# Patient Record
Sex: Female | Born: 1955 | Race: White | Hispanic: No | Marital: Married
Health system: Midwestern US, Academic
[De-identification: ages and names within clinical notes are randomized; demographics above are authoritative.]

---

## 2021-11-01 ENCOUNTER — Encounter: Admit: 2021-11-01 | Discharge: 2021-11-01 | Payer: BC Managed Care – HMO

## 2021-11-01 DIAGNOSIS — C8170 Other classical Hodgkin lymphoma, unspecified site: Secondary | ICD-10-CM

## 2021-11-02 ENCOUNTER — Encounter: Admit: 2021-11-02 | Discharge: 2021-11-02 | Payer: BC Managed Care – HMO

## 2021-11-02 DIAGNOSIS — I10 Essential (primary) hypertension: Secondary | ICD-10-CM

## 2021-11-02 DIAGNOSIS — C8178 Other classical Hodgkin lymphoma, lymph nodes of multiple sites: Secondary | ICD-10-CM

## 2021-11-02 MED ORDER — DOXORUBICIN 2 MG/ML IV SOLN
25 mg/m2 | Freq: Once | INTRAVENOUS | 0 refills
Start: 2021-11-02 — End: ?

## 2021-11-02 MED ORDER — DACARBAZINE IVPB
375 mg/m2 | Freq: Once | INTRAVENOUS | 0 refills
Start: 2021-11-02 — End: ?

## 2021-11-02 MED ORDER — DEXAMETHASONE 6 MG PO TAB
12 mg | Freq: Once | ORAL | 0 refills
Start: 2021-11-02 — End: ?

## 2021-11-02 MED ORDER — VINBLASTINE IVPB
6 mg/m2 | Freq: Once | INTRAVENOUS | 0 refills
Start: 2021-11-02 — End: ?

## 2021-11-02 MED ORDER — APREPITANT 7.2 MG/ML IV EMUL
130 mg | Freq: Once | INTRAVENOUS | 0 refills
Start: 2021-11-02 — End: ?

## 2021-11-02 MED ORDER — PALONOSETRON 0.25 MG/5 ML IV SOLN
.25 mg | Freq: Once | INTRAVENOUS | 0 refills
Start: 2021-11-02 — End: ?

## 2021-11-02 MED ORDER — BRENTUXIMAB IVPB
1.2 mg/kg | Freq: Once | INTRAVENOUS | 0 refills
Start: 2021-11-02 — End: ?

## 2021-11-02 NOTE — Progress Notes
Name: Mary Terrell          MRN: [id number]      DOB: 05/31/1956      AGE: 66 y.o.   DATE OF SERVICE: 11/02/2021    Subjective:             Reason for Visit:  New Patient      Mary Terrell is a 66 y.o. female.     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 Donnajean Lopes.    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  Presented December 2022 with close to a year of persistent dry cough and chest x-ray showed a mediastinal mass.  2.  CT scans of the chest abdomen pelvis revealed multifocal lymphadenopathy above and below the diaphragm along with splenic lesions and diffuse sclerosis of the L2 vertebral body suggestive of an infiltrative process.  3.  10/12/2021 excisional biopsy of right axillary node revealed classical Hodgkin lymphoma.    On interview today, she is feeling extremely well.  She continues to have a persistent dry cough but this does not interrupt sleep.  It is bothersome, but not overly so.  She is not significantly short of breath and she does not have any significant chest pain.  She denies fevers, drenching night sweats, or unintentional weight loss.  She has not had any significant pruritus.    I have reviewed and updated the past medical, social and family histories in the history section and they are up to date as of this visit.    I have extensively reviewed the laboratory, pathology and radiology, both internal and external, and the key findings are summarized above.           Review of Systems   Respiratory: Positive for cough.    Hematological: Positive for adenopathy.   All other systems reviewed and are negative.        Objective:         ? losartan-hydroCHLOROthiazide (HYZAAR) 50-12.5 mg tablet Take 1 tablet by mouth every morning.     Vitals:    11/02/21 1556   BP: (!) 132/92   BP Source: Arm, Right Upper   Pulse: 96   Temp: 37 ?C (98.6 ?F)   Resp: 18   SpO2: 99%   TempSrc: Oral   PainSc: Zero   Weight: 73.5 kg (162 lb)   Height: 165.1 cm (5' 5)     Body mass index is 26.96 kg/m?Marland Kitchen     Pain Score: Zero       Fatigue Scale: 7    Pain Addressed:  N/A    Patient Evaluated for a Clinical Trial: No treatment clinical trial available for this patient.     [place] Cooperative Oncology Group performance status is 0, Fully active, able to carry on all pre-disease performance without restriction.Marland Kitchen     Physical Exam  Vitals and nursing note reviewed.   Constitutional:       General: She is not in acute distress.     Appearance: She is well-developed.   HENT:      Head: Normocephalic.   Eyes:  General: No scleral icterus.     Conjunctiva/sclera: Conjunctivae normal.   Cardiovascular:      Rate and Rhythm: Normal rate and regular rhythm.   Pulmonary:      Effort: Pulmonary effort is normal.      Breath sounds: Normal breath sounds.   Abdominal:      Palpations: Abdomen is soft.   Musculoskeletal:      Cervical back: Neck supple.   Lymphadenopathy:      Comments:   Bilateral 1 to 2 cm cervical and supraclavicular nodes   Skin:     Findings: No rash.   Neurological:      Mental Status: She is alert.               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 Advanced stage classical Hodgkin lymphoma  2. Hypertension  3. Hyperlipidemia  4. ECOG PS 0    Plan:  I had a detailed discussion with Mekisha today regarding the natural history, biology, staging, and treatment of classical Hodgkin lymphoma.   We discussed that the treatment algorithms for the disease are divided into 3 separate and distinct subgroups.  These include early stage favorable, early stage unfavorable, and advanced stage disease subtypes.  In his case, she has advanced stage classical Hodgkin lymphoma by GHSG criteria.  We discussed that a PET scan will be definitive, but I am quite suspicious that the L2 lesion seen on CT scans represents a bony focus of Hodgkin lymphoma and as a consequence that she would have stage IV disease.  We discussed that her disease is considered curable regardless of stage and we would be treating her with curative intent.  The recent long-term follow-up from the [facility] 1 study showed an overall survival benefit with the addition of brentuximab vedotin to AVD compared to ABVD therapy.  Unfortunately elderly patients were underrepresented on this study, with fewer than 10% of the study population being over 73 years old.  Typically tolerance of BV plus AVD is not as good among the elderly population and there are multiple other regimens that have been used in the setting which have reasonable outcomes, but certainly do not appear to have the same curative potential.  She is 66 years old and quite fit and I believe that she should be treated aggressively with BV plus AVD.  She agrees.  Check full baseline lymphoma labs, including ESR  Check PET/CT.  Recent data indicate there is no need to check a bone marrow exam unless this would alter treatment.  Given that we know that she has at least stage III disease I do not think that a bone marrow biopsy will be helpful in her case.  Check TTE  Start allopurinol 300mg  qHS a couple of days prior to chemotherapy.  Chemotherapy teaching visit next available  Port placement next available  Start BV + AVD with C1D1 = 11/11/2021  PEGfilgrastim day for 3 = 11/14/2021  Repeat PET/CT after 2 cycles of therapy.  RTC with Lyla Son for education, with me for C1 D15, or sooner should new or concerning symptoms develop.    I have discussed the diagnosis and treatment plan with the patient and she expresses understanding and wishes to proceed.    The above discussed with Dr. Donnajean Lopes, the referring physician, conclusion of today's visit.

## 2021-11-02 NOTE — Patient Instructions
Thank you for choosing the [facility]    Dr. Marc Hoffmann's clinic information:    The preferred method of communication to reach our clinic is by sending your question through the mychart portal. Select "ask a question" and choose the recipient as Dr. Hoffmann for the quickest response from one of his staff members.  If you have difficulties signing up for this or with signing into your account, please reach out to [phone] for assistance.     The Clinical Nurse Coordinator for Dr. Hoffmann is Laura-Lee Villegas, RN and her direct line is [phone].     Mychart messages and Joletta Manner's direct line are answered during normal business hours only. Please do not page our clinic for non-urgent needs as this takes away our attention from patients being seen in clinic that day.     If you have a scheduling concern or need to make a change in your schedule please contact [phone] and ask to speak to a scheduling representative.     If you have a concern after hours that needs addressed immediately you can call [phone] and speak with the on-call physician for further direction.

## 2021-11-02 NOTE — Progress Notes
[facility] [place] # [id number], Path Date 10/12/21 , requested on 11/02/2021 from [facility].    Contact for outside path lab is [phone]  Tracking [phone]

## 2021-11-03 ENCOUNTER — Encounter: Admit: 2021-11-03 | Discharge: 2021-11-03 | Payer: BC Managed Care – HMO

## 2021-11-04 ENCOUNTER — Encounter: Admit: 2021-11-04 | Discharge: 2021-11-04 | Payer: BC Managed Care – HMO

## 2021-11-04 ENCOUNTER — Ambulatory Visit: Admit: 2021-11-04 | Discharge: 2021-11-04 | Payer: BC Managed Care – HMO

## 2021-11-04 DIAGNOSIS — C8198 Hodgkin lymphoma, unspecified, lymph nodes of multiple sites: Secondary | ICD-10-CM

## 2021-11-04 DIAGNOSIS — I10 Essential (primary) hypertension: Secondary | ICD-10-CM

## 2021-11-04 DIAGNOSIS — C8170 Other classical Hodgkin lymphoma, unspecified site: Secondary | ICD-10-CM

## 2021-11-04 LAB — CBC AND DIFF
ABSOLUTE BASO COUNT: 0.1 K/UL (ref 0–0.20)
ABSOLUTE EOS COUNT: 0.2 K/UL (ref 0–0.45)
ABSOLUTE LYMPH COUNT: 0.9 K/UL — ABNORMAL LOW (ref 1.0–4.8)
ABSOLUTE MONO COUNT: 1 K/UL — ABNORMAL HIGH (ref 0–0.80)
ABSOLUTE NEUTROPHIL: 10 K/UL — ABNORMAL HIGH (ref 1.8–7.0)
BASOPHILS %: 0 % (ref 0–2)
EOSINOPHILS %: 1 % (ref >60)
HEMATOCRIT: 34 % — ABNORMAL LOW (ref 36–45)
HEMOGLOBIN: 11 g/dL — ABNORMAL LOW (ref 12.0–15.0)
LYMPHOCYTES %: 7 % — ABNORMAL LOW (ref 24–44)
MCH: 25 pg — ABNORMAL LOW (ref 26–34)
MCV: 81 FL (ref 80–100)
MONOCYTES %: 8 % — ABNORMAL HIGH (ref 4–12)
MPV: 7.5 FL (ref 7–11)
NEUTROPHILS %: 84 % — ABNORMAL HIGH (ref 41–77)
PLATELET COUNT: 440 K/UL — ABNORMAL HIGH (ref 150–400)
RBC COUNT: 4.2 M/UL — ABNORMAL HIGH (ref 4.0–5.0)
RDW: 15 % — ABNORMAL HIGH (ref 11–15)
WBC COUNT: 12 K/UL — ABNORMAL HIGH (ref 4.5–11.0)

## 2021-11-04 LAB — HEPATITIS B CORE AB TOT (IGG+IGM)

## 2021-11-04 LAB — HEPATITIS B SURFACE AB: HEP B SURFACE ABY: NEGATIVE

## 2021-11-04 LAB — LDH-LACTATE DEHYDROGENASE: LDH: 440 U/L — ABNORMAL HIGH (ref 100–210)

## 2021-11-04 LAB — IMMUNOGLOBULINS-IGA,IGG,IGM
IGA: 114 mg/dL (ref 70–390)
IGG: 105 mg/dL (ref 762–1488)
IGM: 174 mg/dL (ref 38–328)

## 2021-11-04 LAB — HIV 1 & 2 AG-AB SCRN W REFLEX TO HIV CONFIRMATION

## 2021-11-04 LAB — PHOSPHORUS: PHOSPHORUS: 3.3 mg/dL (ref 2.0–4.5)

## 2021-11-04 LAB — PTT (APTT): PTT: 32 s (ref 24.0–36.5)

## 2021-11-04 LAB — D-DIMER: D-DIMER: 683 ng{FEU}/mL — ABNORMAL HIGH (ref <500)

## 2021-11-04 LAB — KAPPA/LAMBDA FREE LIGHT CHAINS
KAPPA FLC: 2.6 mg/dL — ABNORMAL HIGH (ref 0.33–1.94)
LAMBDA FLC: 1.7 mg/dL (ref 0.57–2.63)

## 2021-11-04 LAB — HEPATITIS C ANTIBODY W REFLEX HCV PCR QUANT

## 2021-11-04 LAB — PROTIME INR (PT)
INR: 1.3 — ABNORMAL HIGH (ref 0.8–1.2)
PROTIME: 14 s — ABNORMAL HIGH (ref 9.5–14.2)

## 2021-11-04 LAB — SED RATE: ESR: 72 mm/h — ABNORMAL HIGH (ref 0–30)

## 2021-11-04 LAB — COMPREHENSIVE METABOLIC PANEL
POTASSIUM: 3.8 MMOL/L (ref 3.5–5.1)
SODIUM: 139 MMOL/L (ref 137–147)

## 2021-11-04 LAB — URIC ACID: URIC ACID: 6.1 mg/dL (ref 2.0–7.0)

## 2021-11-04 LAB — FIBRINOGEN: FIBRINOGEN: 585 mg/dL — ABNORMAL HIGH (ref 200–400)

## 2021-11-04 LAB — HEPATITIS B SURFACE AG

## 2021-11-04 MED ORDER — CEFAZOLIN 1 GRAM IJ SOLR
2 g | Freq: Once | INTRAVENOUS | 0 refills
Start: 2021-11-04 — End: ?

## 2021-11-04 MED ORDER — ONDANSETRON HCL 8 MG PO TAB
8 mg | ORAL_TABLET | ORAL | 3 refills | 8.00000 days | Status: AC | PRN
Start: 2021-11-04 — End: ?

## 2021-11-04 NOTE — Telephone Encounter
Pt scheduled for Port Insert with Dr.Alley on 11/09/21 at [place].    Instructed use of anesthesia vs. local only would be determined the morning of surgery.    Instructions given, pt states understanding.

## 2021-11-04 NOTE — Telephone Encounter
Attempted to contact patient to schedule port placement, LVM for callback.

## 2021-11-04 NOTE — Telephone Encounter
Recieved message from [facility] that facility does not have contract with patient insurance. Will keep surgery date of 2/15 but change to [facility] location.  Called and left pt VM and will send mychart to let her know we are moving to [facility]>

## 2021-11-04 NOTE — Progress Notes
Name: Mary Terrell          MRN: [id number]      DOB: May 05, 1956      AGE: 66 y.o.   DATE OF SERVICE: 11/04/2021    Subjective:             Reason for Visit:  Follow Up      Mary Terrell is a 66 y.o. female.     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 Mikey Bussing with Hodgkin's lymphoma.  Mary Terrell is a patient of Dr. Donnajean Lopes.    Mary Terrell will be initiating brentuximab plus AVD on 2/17 with pegfilgrastim support.  Mary Terrell is here for education.  ?  HPI  1.  Presented December 2022 with close to a year of persistent dry cough and chest x-ray showed a mediastinal mass.  2.  CT scans of the chest abdomen pelvis revealed multifocal lymphadenopathy above and below the diaphragm along with splenic lesions and diffuse sclerosis of the L2 vertebral body suggestive of an infiltrative process.  3.  10/12/2021 excisional biopsy of right axillary node revealed classical Hodgkin lymphoma.  4. AV+AVD      Subjective  Complains of dry cough  Complains of lymph nodes in her neck but no pain  Energy is good  Mary Terrell continues to work without trouble  No fevers or infectious symptoms  No shortness of breath or cough    Mary Terrell is alone     Review of Systems      Objective:         ? losartan-hydroCHLOROthiazide (HYZAAR) 50-12.5 mg tablet Take 1 tablet by mouth every morning.     Vitals:    11/04/21 1108   BP: 136/82   BP Source: Arm, Right Upper   Pulse: 80   Temp: 37.2 ?C (99 ?F)   Resp: 18   SpO2: 100%   TempSrc: Oral   PainSc: Zero   Weight: 72.8 kg (160 lb 9.6 oz)     Body mass index is 26.73 kg/m?Marland Kitchen     Pain Score: Zero         Pain Addressed:  [place] Cooperative Oncology Group performance status is 0, Fully active, able to carry on all pre-disease performance without restriction.Marland Kitchen     Physical Exam  Vitals reviewed.   Pulmonary:      Effort: Pulmonary effort is normal.   Neurological:      General: No focal deficit present.      Mental Status: Mary Terrell is oriented to person, place, and time.   Psychiatric:         Mood and Affect: Mood normal.         Behavior: Behavior normal.            CBC w/Diff    Lab Results   Component Value Date/Time    WBC 12.9 (H) 11/04/2021 11:01 AM    RBC 4.29 11/04/2021 11:01 AM    HGB 11.1 (L) 11/04/2021 11:01 AM    HCT 34.9 (L) 11/04/2021 11:01 AM    MCV 81.5 11/04/2021 11:01 AM    MCH 25.8 (L) 11/04/2021 11:01 AM    MCHC 31.7 (L) 11/04/2021 11:01 AM  RDW 15.8 (H) 11/04/2021 11:01 AM    PLTCT 440 (H) 11/04/2021 11:01 AM    MPV 7.5 11/04/2021 11:01 AM    Lab Results   Component Value Date/Time    NEUT 84 (H) 11/04/2021 11:01 AM    ANC 10.70 (H) 11/04/2021 11:01 AM    LYMA 7 (L) 11/04/2021 11:01 AM    ALC 0.90 (L) 11/04/2021 11:01 AM    MONA 8 11/04/2021 11:01 AM    AMC 1.00 (H) 11/04/2021 11:01 AM    EOSA 1 11/04/2021 11:01 AM    AEC 0.20 11/04/2021 11:01 AM    BASA 0 11/04/2021 11:01 AM    ABC 0.10 11/04/2021 11:01 AM             Assessment and Plan:         1. Advanced stage classical Hodgkin lymphoma.  Mary Terrell will initiate brentuximab with AVD on 2/17 with pegfilgrastim support on 2/20.  I will see her that day for toxicity check and we will plan to have Dr. Mikey Bussing see her prior to cycle 1 day 15 on 3/3 unless Mary Terrell sees her primary oncologist closer to home to initiate treatment.  2. Education complete and consent signed.  Everything answered to her satisfaction.  Phone numbers and contact information provided.  3. Allopurinol 300 mg x 30 days.  Mary Terrell will start on 11/08/2021.  4. Zofran 8 mg to take every 8 hours as needed sent to pharmacy.  5. Mary Terrell will take Claritin 10 mg x 5 days starting on the day of pegfilgrastim with each cycle.   6. Echocardiogram and port scheduled for 2/15.  7. Baseline lymphoma labs drawn today.  ?    APP Chemotherapy Education      A thorough pre-assessment and teaching session explaining the mechanism of action, possible side effects, precautions and instructions regarding brentuximab, lasting, dacarbazine, doxorubicin, pegfilgrastim for curative intent was conducted. The patient will return on 2/17 to initiate treatment. The cycle will repeat every 14 days   Plan of administration was reviewed.      Both written and verbal information were given to the patient.    The planned course of treatment, anticipated benefits, material risks and potential side effects that may occur with this course of treatment were explained to the patient.  Side effects and their management were discussed in detail and include, but are not limited to:      Discussed schedule of chemotherapy. Patient will receive treatment as follows on Days 1 and 15 of a 28-day cycle:       Both written and verbal information were given to the patient.     The planned course of treatment, anticipated benefits, material risks and potential side effects that may occur with this course of treatment were explained to the patient.  Side effects and their management were discussed in detail and include, but are not limited to:   ? Low blood counts (explained associated risk for infection, bleeding, bruising, fatigue)  ? Nausea and vomiting (explained purpose of scheduled antiemetics before chemotherapy and the use of PRNs in the event of breakthrough CINV; encouraged patient to maintain adequate water/nutrition intake)  ? Changes in bowel habits   ? Anthracycline-induced cardiotoxicity (explained that cardiac function is monitored closely throughout the treatment course)   ? Alopecia  ? Hypersensitivity reactions  ? Peripheral neuropathy  ? Rash  ? Infusion/hypersensitivity reactions          The patient has received contact information for the  clinic and was instructed on when and who to call.      The patient verbalized understanding, was given the opportunity to ask questions, and the consent form was signed.        This was a face to face encounter with 60 minutes spent in counseling and coordination of care.

## 2021-11-09 ENCOUNTER — Encounter: Admit: 2021-11-09 | Discharge: 2021-11-09 | Payer: BC Managed Care – HMO

## 2021-11-09 ENCOUNTER — Ambulatory Visit: Admit: 2021-11-09 | Discharge: 2021-11-09 | Payer: BC Managed Care – HMO

## 2021-11-09 DIAGNOSIS — C8170 Other classical Hodgkin lymphoma, unspecified site: Secondary | ICD-10-CM

## 2021-11-09 DIAGNOSIS — C8178 Other classical Hodgkin lymphoma, lymph nodes of multiple sites: Principal | ICD-10-CM

## 2021-11-09 DIAGNOSIS — I10 Essential (primary) hypertension: Secondary | ICD-10-CM

## 2021-11-09 MED ORDER — ONDANSETRON HCL (PF) 4 MG/2 ML IJ SOLN
INTRAVENOUS | 0 refills | Status: DC
Start: 2021-11-09 — End: 2021-11-09
  Administered 2021-11-09: 16:00:00 4 mg via INTRAVENOUS

## 2021-11-09 MED ORDER — PHENYLEPHRINE HCL 10 MG/ML IJ SOLN
INTRAVENOUS | 0 refills | Status: DC
Start: 2021-11-09 — End: 2021-11-09
  Administered 2021-11-09: 17:00:00 100 ug via INTRAVENOUS

## 2021-11-09 MED ORDER — LIDOCAINE (PF) 100 MG/5 ML (2 %) IV SYRG
INTRAVENOUS | 0 refills | Status: DC
Start: 2021-11-09 — End: 2021-11-09
  Administered 2021-11-09: 16:00:00 20 mg via INTRAVENOUS
  Administered 2021-11-09: 16:00:00 70 mg via INTRAVENOUS

## 2021-11-09 MED ORDER — MIDAZOLAM 1 MG/ML IJ SOLN
INTRAVENOUS | 0 refills | Status: DC
Start: 2021-11-09 — End: 2021-11-09
  Administered 2021-11-09: 16:00:00 2 mg via INTRAVENOUS

## 2021-11-09 MED ORDER — PROPOFOL 10 MG/ML IV EMUL 50 ML (INFUSION)(AM)(OR)
INTRAVENOUS | 0 refills | Status: DC
Start: 2021-11-09 — End: 2021-11-09
  Administered 2021-11-09: 16:00:00 70 ug/kg/min via INTRAVENOUS

## 2021-11-09 MED ORDER — PROPOFOL 10 MG/ML IV EMUL 20 ML (INFUSION)(AM)(OR)
INTRAVENOUS | 0 refills | Status: DC
Start: 2021-11-09 — End: 2021-11-09

## 2021-11-09 MED ORDER — FENTANYL CITRATE (PF) 50 MCG/ML IJ SOLN
INTRAVENOUS | 0 refills | Status: DC
Start: 2021-11-09 — End: 2021-11-09
  Administered 2021-11-09 (×4): 25 ug via INTRAVENOUS

## 2021-11-09 MED ORDER — PROPOFOL INJ 10 MG/ML IV VIAL
INTRAVENOUS | 0 refills | Status: DC
Start: 2021-11-09 — End: 2021-11-09
  Administered 2021-11-09: 16:00:00 50 mg via INTRAVENOUS

## 2021-11-09 MED ADMIN — CEFAZOLIN 1 GRAM IJ SOLR [1445]: 2 g | INTRAVENOUS | @ 16:00:00 | Stop: 2021-11-09 | NDC 60505614200

## 2021-11-09 MED ADMIN — HEPARIN (PORCINE) 1,000 UNIT/ML IJ SOLN [10176]: 500 mL | @ 16:00:00 | Stop: 2021-11-09 | NDC 63323054015

## 2021-11-09 MED ADMIN — BUPIVACAINE-EPINEPHRINE 0.25 %-1:200,000 IJ SOLN [14983]: 15 mL | INTRAMUSCULAR | @ 17:00:00 | Stop: 2021-11-09 | NDC 00409175250

## 2021-11-09 MED ADMIN — LACTATED RINGERS IV SOLP [4318]: 1000.000 mL | INTRAVENOUS | @ 15:00:00 | Stop: 2021-11-09 | NDC 00338011704

## 2021-11-09 MED ADMIN — SODIUM CHLORIDE 0.9 % IV SOLP [27838]: 500 mL | @ 16:00:00 | Stop: 2021-11-09 | NDC 00338004903

## 2021-11-09 NOTE — Anesthesia Post-Procedure Evaluation
Post-Anesthesia Evaluation    Name: Mary Terrell      MRN: [id number]     DOB: 04-Sep-1956     Age: 66 y.o.     Sex: female   __________________________________________________________________________     Procedure Information     Anesthesia Start Date/Time: 11/09/21 0959    Procedures:       placement of port-a-cath with fluoroscopy (Chest) - **30 mins.      FLUOROSCOPIC GUIDANCE CENTRAL VENOUS ACCESS DEVICE PLACEMENT/ REPLACEMENT/ REMOVAL (Chest)    Location: [facility] OR 5 / [facility] OR/PERIOP    Surgeons: Kelli Churn, MD          Post-Anesthesia Vitals  BP: 103/75 (02/15 1117)  Temp: 36.8 C (98.2 F) (02/15 1117)  Pulse: 72 (02/15 1117)  Respirations: 20 PER MINUTE (02/15 1117)  SpO2: 92 % (02/15 1117)  SpO2 Pulse: 72 (02/15 1117)  O2 Device: None (Room air) (02/15 1117)  Height: 165.1 cm (5\' 5" ) (02/15 0901)   Vitals Value Taken Time   BP 103/75 11/09/21 1117   Temp 36.8 C (98.2 F) 11/09/21 1117   Pulse 72 11/09/21 1117   Respirations 20 PER MINUTE 11/09/21 1117   SpO2 92 % 11/09/21 1117   O2 Device None (Room air) 11/09/21 1117   ABP     ART BP           Post Anesthesia Evaluation Note    Evaluation location: pre/post  Patient participation: recovered; patient participated in evaluation  Level of consciousness: alert    Pain score: 0  Pain management: adequate    Hydration: normovolemia  Temperature: 36.0C - 38.4C  Airway patency: adequate    Perioperative Events       Post-op nausea and vomiting: no PONV    Postoperative Status  Cardiovascular status: hemodynamically stable  Respiratory status: spontaneous ventilation        Perioperative Events  There were no known notable events for this encounter.

## 2021-11-09 NOTE — Anesthesia Pre-Procedure Evaluation
Anesthesia Pre-Procedure Evaluation    Name: Mary Terrell      MRN: [id number]     DOB: Aug 29, 1956     Age: 66 y.o.     Sex: female   _________________________________________________________________________     Procedure Info:   Procedure Information     Date/Time: 11/09/21 1030    Procedures:       placement of port-a-cath with fluoroscopy - **30 mins.      FLUOROSCOPIC GUIDANCE CENTRAL VENOUS ACCESS DEVICE PLACEMENT/ REPLACEMENT/ REMOVAL    Location: [facility] / [facility]    Surgeons: Teddy Spike, MD          Physical Assessment  Vital Signs (last filed in past 24 hours):  BP: 136/85 (02/15 0912)  Temp: 37.1 ?C (98.8 ?F) (02/15 1191)  Pulse: 85 (02/15 0912)  Respirations: 16 PER MINUTE (02/15 0912)  SpO2: 97 % (02/15 0912)  O2 Device: None (Room air) (02/15 0912)  Height: 165.1 cm (5' 5) (02/15 0901)  Weight: 73.9 kg (163 lb) (02/15 0901)      Patient History   No Known Allergies     Current Medications    Medication Directions   losartan-hydroCHLOROthiazide (HYZAAR) 50-12.5 mg tablet Take one tablet by mouth every morning.   ondansetron HCL (ZOFRAN) 8 mg tablet Take one tablet by mouth every 8 hours as needed for Nausea or Vomiting.         Review of Systems/Medical History      Patient summary reviewed  Nursing notes reviewed  Pertinent labs reviewed    PONV Screening: Non-smoker, Postoperative opioids and Female sex  No history of anesthetic complications  No family history of anesthetic complications      Airway - negative        Pulmonary - negative     Not a current smoker        No indications/hx of asthma    no COPD      Cardiovascular         Exercise tolerance: >4 METS      Beta Blocker therapy: No      Beta blockers within 24 hours: n/a        Hypertension, well controlled      No valvular problems/murmurs        No past MI:        No hx of coronary artery disease      No PTCA      No dysrhythmias      No angina        No dyspnea on exertion      GI/Hepatic/Renal - negative        No GERD,       No liver disease:        No renal disease:        Neuro/Psych - negative      No seizures      No CVA      Musculoskeletal - negative        No neck pain      Endocrine/Other       No diabetes      No hypothyroidism      No anemia      Malignancy (hodgkin's lymphoma):    current      Constitution - negative   Physical Exam    Airway Findings      Mallampati: II      TM distance: >3 FB  Neck ROM: full      Mouth opening: good      Airway patency: adequate    Dental Findings: Negative      Cardiovascular Findings:       Rhythm: regular      Rate: normal      No murmur    Pulmonary Findings:       Breath sounds clear to auscultation.    Neurological Findings:       Alert and oriented x 3    Constitutional findings:       No acute distress      Well-developed      Well-nourished       Diagnostic Tests  Hematology:   Lab Results   Component Value Date    HGB 11.1 11/04/2021    HCT 34.9 11/04/2021    PLTCT 440 11/04/2021    WBC 12.9 11/04/2021    NEUT 84 11/04/2021    ANC 10.70 11/04/2021    ALC 0.90 11/04/2021    MONA 8 11/04/2021    AMC 1.00 11/04/2021    EOSA 1 11/04/2021    ABC 0.10 11/04/2021    MCV 81.5 11/04/2021    MCH 25.8 11/04/2021    MCHC 31.7 11/04/2021    MPV 7.5 11/04/2021    RDW 15.8 11/04/2021         General Chemistry:   Lab Results   Component Value Date    NA 139 11/04/2021    K 3.8 11/04/2021    CL 100 11/04/2021    CO2 25 11/04/2021    GAP 14 11/04/2021    BUN 17 11/04/2021    CR 0.81 11/04/2021    GLU 101 11/04/2021    CA 9.3 11/04/2021    ALBUMIN 3.7 11/04/2021    TOTBILI 0.6 11/04/2021    PO4 3.3 11/04/2021      Coagulation:   Lab Results   Component Value Date    PT 14.3 11/04/2021    PTT 32.9 11/04/2021    INR 1.3 11/04/2021         Anesthesia Plan    ASA score: 2   Plan: MAC  Induction method: intravenous  NPO status: acceptable      Informed Consent  Anesthetic plan and risks discussed with patient.        Plan discussed with: anesthesiologist, CRNA and surgeon/proceduralist.  Comments: (I discussed the risks/benefits of proceeding with Monitored Anesthesia Care including: 1)possible awareness during the procedure 2) change of anesthetic plan and conversion to a general anesthetic 3)the need to be awakened during key portions of the procedure by the surgeon.  The patient expressed understanding and will proceed with Monitored Anesthesia Care.)

## 2021-11-11 ENCOUNTER — Encounter: Admit: 2021-11-11 | Discharge: 2021-11-11 | Payer: BC Managed Care – HMO

## 2021-11-11 DIAGNOSIS — C8178 Other classical Hodgkin lymphoma, lymph nodes of multiple sites: Secondary | ICD-10-CM

## 2021-11-11 DIAGNOSIS — I10 Essential (primary) hypertension: Secondary | ICD-10-CM

## 2021-11-11 MED ORDER — HEPARIN, PORCINE (PF) 100 UNIT/ML IV SYRG
500 [IU] | Freq: Once | 0 refills | Status: CP
Start: 2021-11-11 — End: ?

## 2021-11-11 MED ORDER — BRENTUXIMAB IVPB
1.2 mg/kg | Freq: Once | INTRAVENOUS | 0 refills | Status: CP
Start: 2021-11-11 — End: ?
  Administered 2021-11-11 (×2): 89.4 mg via INTRAVENOUS

## 2021-11-11 MED ORDER — DACARBAZINE IVPB
375 mg/m2 | Freq: Once | INTRAVENOUS | 0 refills | Status: CP
Start: 2021-11-11 — End: ?
  Administered 2021-11-11 (×2): 693.8 mg via INTRAVENOUS

## 2021-11-11 MED ORDER — VINBLASTINE IVPB
6 mg/m2 | Freq: Once | INTRAVENOUS | 0 refills | Status: CP
Start: 2021-11-11 — End: ?
  Administered 2021-11-11 (×2): 11.1 mg via INTRAVENOUS

## 2021-11-11 MED ORDER — PALONOSETRON 0.25 MG/5 ML IV SOLN
.25 mg | Freq: Once | INTRAVENOUS | 0 refills | Status: CP
Start: 2021-11-11 — End: ?
  Administered 2021-11-11: 18:00:00 0.25 mg via INTRAVENOUS

## 2021-11-11 MED ORDER — APREPITANT 7.2 MG/ML IV EMUL
130 mg | Freq: Once | INTRAVENOUS | 0 refills | Status: CP
Start: 2021-11-11 — End: ?
  Administered 2021-11-11: 18:00:00 130 mg via INTRAVENOUS

## 2021-11-11 MED ORDER — DEXAMETHASONE 6 MG PO TAB
12 mg | Freq: Once | ORAL | 0 refills | Status: CP
Start: 2021-11-11 — End: ?
  Administered 2021-11-11: 18:00:00 12 mg via ORAL

## 2021-11-11 MED ORDER — DOXORUBICIN 2 MG/ML IV SOLN
25 mg/m2 | Freq: Once | INTRAVENOUS | 0 refills | Status: CP
Start: 2021-11-11 — End: ?
  Administered 2021-11-11: 19:00:00 46.26 mg via INTRAVENOUS

## 2021-11-11 NOTE — Progress Notes
CHEMO NOTE  Verified chemo consent signed and in chart.    Blood return positive via: Port (Single)    BSA and dose double checked (agree with orders as written) with: yes  see MAR    Labs/applicable tests checked: CBC and Comprehensive Metabolic Panel (CMP)    Chemo regimen: Drug/cycle/day cycle 1, day 1 Adriamycin, Velban, Dacarbazine, Brentuximab    Rate verified and armband double check with second RN: yes    Patient education offered and stated understanding. Denies questions at this time.

## 2021-11-13 NOTE — Progress Notes
Name: Mary Terrell          MRN: [id number]      DOB: 11/21/1955      AGE: 66 y.o.   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 female patient of Dr.Hoffmann's with cHL seen today for toxicity check, labs and pegfilgrastim injection.  She is cycle 1 day 4.    HPI  1.  Presented December 2022 with close to a year of persistent dry cough and chest x-ray showed a mediastinal mass.  2.  CT scans of the chest abdomen pelvis revealed multifocal lymphadenopathy above and below the diaphragm along with splenic lesions and diffuse sclerosis of the L2 vertebral body suggestive of an infiltrative process.  3.  10/12/2021 excisional biopsy of right axillary node revealed classical Hodgkin lymphoma.  4. AV+AVD started 11/11/2021      Interim History 11/14/2021:  I'm doing really well.   Endorses strange feeling on back of tongue. Denies taste changes, coating feeling, or pain. Not interfering with diet.   Cough is much better.   Patient thinks lymph nodes are smaller, bilateral neck  Eating and drinking well. Drinking ~60oz water daily.   More tired over weekend, sleeping OK  Endorses nocturia at baseline.   Denies nausea, vomiting, diarrhea, or constipation.   Taking allopurinol  Started Claritin.     Denies fevers, chills, drenching night sweats, unintentional weight loss or new lymphadenopathy.   Denies respiratory symptoms.   No infections, transfusions, or unplanned hospitalizations in the interim.    She is with her husband.     Review of Systems   Constitutional: Positive for fatigue.   HENT: Negative for mouth sores.         Strange taste Respiratory: Positive for cough.    Gastrointestinal: Negative for abdominal pain, constipation, diarrhea, nausea and vomiting.   Genitourinary:        Nocturia   Neurological: Negative for numbness.   Hematological: Positive for adenopathy.         Objective:         ? losartan-hydroCHLOROthiazide (HYZAAR) 50-12.5 mg tablet Take one tablet by mouth every morning.   ? ondansetron HCL (ZOFRAN) 8 mg tablet Take one tablet by mouth every 8 hours as needed for Nausea or Vomiting.     Vitals:    11/14/21 1518 11/14/21 1524 11/14/21 1525   BP: (!) 148/87     BP Source: Arm, Right Upper Arm, Right Upper    Pulse: 85     Temp: 36.9 ?C (98.4 ?F)     Resp: 16     SpO2: 100%     O2 Device:  None (Room air)    TempSrc: Oral Oral    PainSc: Zero  Zero   Weight: 73.5 kg (162 lb) 73.5 kg (162 lb)    Height: 165.1 cm (5' 5)       Body mass index is 26.96 kg/m?Marland Kitchen     Pain Score:  Zero         Pain Addressed:  [place] Cooperative Oncology Group performance status is 0, Fully active, able to carry on all pre-disease performance without restriction.Marland Kitchen     Physical Exam  Vitals reviewed.   Cardiovascular:      Arteriovenous access: left arteriovenous access is present.     Comments: Left chest PAC, well healed  Pulmonary:      Effort: Pulmonary effort is normal.   Lymphadenopathy:      Cervical: Cervical adenopathy present.      Right cervical: Superficial cervical adenopathy present.      Left cervical: Superficial cervical adenopathy present.      Upper Body:      Right upper body: No supraclavicular or axillary adenopathy.      Left upper body: No supraclavicular or axillary adenopathy.      Lower Body: No right inguinal adenopathy. No left inguinal adenopathy.      Comments: Cervical adenopathy R>L   Neurological:      General: No focal deficit present.      Mental Status: She is oriented to person, place, and time.   Psychiatric:         Mood and Affect: Mood normal.         Behavior: Behavior normal.            CBC w diff  CBC with Diff Latest Ref Rng & Units 11/14/2021 11/11/2021 11/04/2021   WBC 4.5 - 11.0 K/UL 5.8 16.1(H) 12.9(H)   RBC 4.0 - 5.0 M/UL 4.23 4.11 4.29   HGB 12.0 - 15.0 GM/DL 10.9(L) 10.6(L) 11.1(L)   HCT 36 - 45 % 34.3(L) 33.4(L) 34.9(L)   MCV 80 - 100 FL 81.0 81.3 81.5   MCH 26 - 34 PG 25.6(L) 25.8(L) 25.8(L)   MCHC 32.0 - 36.0 G/DL 31.7(L) 31.8(L) 31.7(L)   RDW 11 - 15 % 15.8(H) 15.9(H) 15.8(H)   PLT 150 - 400 K/UL 244 428(H) 440(H)   MPV 7 - 11 FL 7.6 7.2 7.5   NEUT 41 - 77 % 83(H) 80(H) 84(H)   ANC 1.8 - 7.0 K/UL 4.90 12.80(H) 10.70(H)   LYMA 24 - 44 % 12(L) 8(L) 7(L)   ALYM 1.0 - 4.8 K/UL 0.70(L) 1.30 0.90(L)   MONA 4 - 12 % 2(L) 10 8   AMONO 0 - 0.80 K/UL 0.10 1.60(H) 1.00(H)   EOSA 0 - 5 % 2 2 1    AEOS 0 - 0.45 K/UL 0.10 0.40 0.20   BASA 0 - 2 % 1 0 0   ABAS 0 - 0.20 K/UL 0.00 0.10 0.10     Comprehensive Metabolic Profile  CMP Latest Ref Rng & Units 11/11/2021 11/04/2021   NA 137 - 147 MMOL/L 140 139   K 3.5 - 5.1 MMOL/L 3.7 3.8   CL 98 - 110 MMOL/L 101 100   CO2 21 - 30 MMOL/L 27 25   GAP 3 - 12 12 14(H)   BUN 7 - 25 MG/DL 18 17   CR 0.4 - 1.61 MG/DL 0.96 0.45   GLUX 70 - 409 MG/DL 81(X) 914(N)   CA 8.5 - 10.6 MG/DL 9.6 9.3   TP 6.0 - 8.0 G/DL 7.3 7.3   ALB 3.5 - 5.0 G/DL 3.7 3.7   ALKP 25 - 829 U/L 88 91   ALT 7 - 56 U/L 11 12   TBILI 0.3 - 1.2 MG/DL 0.6 0.6          Assessment  and Plan:     1. Advanced stage classical Hodgkin lymphoma.  She initiated brentuximab with AVD on 2/17 with pegfilgrastim support today-2/20.  She did well with treatment and has noticed improvement in her adenopathy.  She is decided to stay with [facility] for treatment.  She will follow-up with Dr. Mikey Bussing prior to her next treatment.  She will call in the interim with any problems or concerns.  2. Repeat PET/CT after 2 cycles of therapy.   3. Continue Allopurinol 300 mg   4. Continue Zofran 8 mg to take every 8 hours as needed sent to pharmacy.  5. Continue Claritin 10 mg x 5 days starting on the day of pegfilgrastim with each cycle. 6. Recommended salt+baking soda rinse for oral debridement. Patient will call with any signs of open oral  sores.     RTC with Dr. Paulene Floor in 2 weeks (11/25/21) or sooner should new or concerning issues arise.    Donnie Aho, APRN-NP  Nurse Practitioner with Dr. Olene Craven and Dr. Lelon Frohlich  Division of Hematology and Oncology  Pager [phone] or available on Voalte    ATTESTATION ?I personally performed or re-performed the history, physical exam and treatment for the E/M. I discussed the case with the Donnie Aho, APRN in training, and concur with her documentation of history, physical exam and treatment plan unless otherwise noted?Charlann Boxer ARNP  Division of Hematology with Dr Paulene Floor

## 2021-11-14 ENCOUNTER — Encounter: Admit: 2021-11-14 | Discharge: 2021-11-14 | Payer: BC Managed Care – HMO

## 2021-11-14 DIAGNOSIS — I10 Essential (primary) hypertension: Secondary | ICD-10-CM

## 2021-11-14 DIAGNOSIS — C8178 Other classical Hodgkin lymphoma, lymph nodes of multiple sites: Secondary | ICD-10-CM

## 2021-11-14 LAB — CBC AND DIFF
HEMATOCRIT: 34 % — ABNORMAL LOW (ref 36–45)
HEMOGLOBIN: 10 g/dL — ABNORMAL LOW (ref 12.0–15.0)
MCH: 25 pg — ABNORMAL LOW (ref 26–34)
MCHC: 31 g/dL — ABNORMAL LOW (ref 32.0–36.0)
MCV: 81 FL (ref 80–100)
MPV: 7.6 FL (ref 7–11)
NEUTROPHILS %: 83 % — ABNORMAL HIGH (ref 41–77)
PLATELET COUNT: 244 K/UL (ref 150–400)
RBC COUNT: 4.2 M/UL — ABNORMAL HIGH (ref 4.0–5.0)
RDW: 15 % — ABNORMAL HIGH (ref 11–15)
WBC COUNT: 5.8 K/UL (ref 4.5–11.0)

## 2021-11-14 LAB — COMPREHENSIVE METABOLIC PANEL
POTASSIUM: 3.6 MMOL/L (ref 3.5–5.1)
SODIUM: 138 MMOL/L (ref 137–147)

## 2021-11-14 MED ORDER — PEGFILGRASTIM-BMEZ 6 MG/0.6 ML SC SYRG
6 mg | Freq: Once | SUBCUTANEOUS | 0 refills | Status: CP
Start: 2021-11-14 — End: ?
  Administered 2021-11-14: 22:00:00 6 mg via SUBCUTANEOUS

## 2021-11-14 NOTE — Progress Notes
Pt arrived in tx ambulatory. Ziextenzo sc injection administered on pt's abd tissue. Pt left clinic ambulatory in good condition.

## 2021-11-15 ENCOUNTER — Encounter: Admit: 2021-11-15 | Discharge: 2021-11-15 | Payer: BC Managed Care – HMO

## 2021-11-25 ENCOUNTER — Encounter: Admit: 2021-11-25 | Discharge: 2021-11-25 | Payer: BC Managed Care – HMO

## 2021-11-25 DIAGNOSIS — C8178 Other classical Hodgkin lymphoma, lymph nodes of multiple sites: Secondary | ICD-10-CM

## 2021-11-25 DIAGNOSIS — I10 Essential (primary) hypertension: Secondary | ICD-10-CM

## 2021-11-25 MED ORDER — BRENTUXIMAB IVPB
1.2 mg/kg | Freq: Once | INTRAVENOUS | 0 refills | Status: CP
Start: 2021-11-25 — End: ?
  Administered 2021-11-25 (×2): 89.4 mg via INTRAVENOUS

## 2021-11-25 MED ORDER — VINBLASTINE IVPB
6 mg/m2 | Freq: Once | INTRAVENOUS | 0 refills | Status: CP
Start: 2021-11-25 — End: ?
  Administered 2021-11-25 (×2): 11.1 mg via INTRAVENOUS

## 2021-11-25 MED ORDER — APREPITANT 7.2 MG/ML IV EMUL
130 mg | Freq: Once | INTRAVENOUS | 0 refills | Status: CP
Start: 2021-11-25 — End: ?
  Administered 2021-11-25: 17:00:00 130 mg via INTRAVENOUS

## 2021-11-25 MED ORDER — DOXORUBICIN 2 MG/ML IV SOLN
25 mg/m2 | Freq: Once | INTRAVENOUS | 0 refills | Status: CP
Start: 2021-11-25 — End: ?
  Administered 2021-11-25: 18:00:00 46.26 mg via INTRAVENOUS

## 2021-11-25 MED ORDER — DACARBAZINE IVPB
375 mg/m2 | Freq: Once | INTRAVENOUS | 0 refills | Status: CP
Start: 2021-11-25 — End: ?
  Administered 2021-11-25 (×2): 693.8 mg via INTRAVENOUS

## 2021-11-25 MED ORDER — PALONOSETRON 0.25 MG/5 ML IV SOLN
.25 mg | Freq: Once | INTRAVENOUS | 0 refills | Status: CP
Start: 2021-11-25 — End: ?
  Administered 2021-11-25: 17:00:00 0.25 mg via INTRAVENOUS

## 2021-11-25 MED ORDER — HEPARIN, PORCINE (PF) 100 UNIT/ML IV SYRG
500 [IU] | Freq: Once | 0 refills | Status: CP
Start: 2021-11-25 — End: ?

## 2021-11-25 MED ORDER — DEXAMETHASONE 6 MG PO TAB
12 mg | Freq: Once | ORAL | 0 refills | Status: CP
Start: 2021-11-25 — End: ?
  Administered 2021-11-25: 17:00:00 12 mg via ORAL

## 2021-11-25 NOTE — Progress Notes
CHEMO NOTE  Verified chemo consent signed and in chart.    Blood return positive via: Port (Single)    BSA and dose double checked (agree with orders as written) with: yes, see MAR.     Labs/applicable tests checked: CBC and Comprehensive Metabolic Panel (CMP)    Chemo regimen: Day 15, Cycle 1; Adriamycin, Velban, DTIC-DOME, Adcetris    Rate verified and armband double check with second RN: yes    Patient education offered and stated understanding. Denies questions at this time.    Pt arrived for treatment. Port accessed, flushed with good blood return. Port labs obtained. Pt seen by provider and okay to treat. Pre-medication was given. Treatment was tolerated. Pt denied any questions or concerns. Port was flushed, heparinized and de-accessed. Pt discharged ambulatory.

## 2021-11-25 NOTE — Progress Notes
Name: Mary Terrell          MRN: [id number]      DOB: [birth year]      AGE: 66 y.o.   DATE OF SERVICE: 11/25/2021    Subjective:             Reason for Visit:  Heme/Onc Care      Mary Terrell is a 66 y.o. female.      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 Donnajean Lopes.    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  Presented December 2022 with close to a year of persistent dry cough and chest x-ray showed a mediastinal mass.  2.  CT scans of the chest abdomen pelvis revealed multifocal lymphadenopathy above and below the diaphragm along with splenic lesions and diffuse sclerosis of the L2 vertebral body suggestive of an infiltrative process.  3.  10/12/2021 excisional biopsy of right axillary node revealed classical Hodgkin lymphoma.  4.  11/03/2021 PET/CT revealed multifocal lymphadenopathy above and below the diaphragm consistent with stage III disease.  The L2 lesion was not noted hypermetabolic.  5.  BV + AVD initiated    Interim History:  I think it went pretty good.  No significant issues.  No neuropathy.  Noted that she had a peculiar sensation in the back of her throat in the first week.  Had some post-prandial abdominal discomfort.  No significant constipation.  Lymphadenopathy resolved.  Denies fevers, drenching night sweats, or unintentional weight loss.    I have reviewed and updated the past medical, social and family histories in the history section and they are up to date as of this visit.    I have extensively reviewed the laboratory, pathology and radiology, both internal and external, and the key findings are summarized above.         Review of Systems   All other systems reviewed and are negative.        Objective:         ? allopurinoL (ZYLOPRIM) 300 mg tablet Take one tablet by mouth daily. Take with food.   ? losartan-hydroCHLOROthiazide (HYZAAR) 50-12.5 mg tablet Take one tablet by mouth every morning.   ? ondansetron HCL (ZOFRAN) 8 mg tablet Take one tablet by mouth every 8 hours as needed for Nausea or Vomiting.       Vitals:    11/25/21 1020 11/25/21 1023   BP: (!) 165/88    Pulse: 85    Temp: 36.9 ?C (98.4 ?F)    Resp: 18    SpO2: 96%    TempSrc: Oral    PainSc: Zero Zero   Weight: 68.9 kg (152 lb)      Body mass index is 25.29 kg/m?Marland Kitchen     Pain Score: Zero       Fatigue Scale: 5    Pain Addressed:  N/A    Patient Evaluated for a Clinical Trial: Patient not eligible for a treatment trial (including not needing treatment, needs palliative care, in remission).     [place] Cooperative Oncology Group performance status is 0, Fully active, able to  carry on all pre-disease performance without restriction.     Physical Exam  Vitals and nursing note reviewed.   Constitutional:       General: She is not in acute distress.     Appearance: She is well-developed.   HENT:      Head: Normocephalic.   Eyes:      General: No scleral icterus.     Conjunctiva/sclera: Conjunctivae normal.   Cardiovascular:      Rate and Rhythm: Normal rate and regular rhythm.   Pulmonary:      Effort: Pulmonary effort is normal.      Breath sounds: Normal breath sounds.   Abdominal:      Palpations: Abdomen is soft.   Musculoskeletal:      Cervical back: Neck supple.   Lymphadenopathy:      Comments:   Cervical and supraclavicular nodes resolved   Skin:     Findings: No rash.   Neurological:      Mental Status: She is alert.               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 Stage III classical Hodgkin lymphoma  2. Hypertension  3. Hyperlipidemia  4. ECOG PS 0    Plan:  Since last visit, she was comprehensively staged with a PET/CT and fortunately the bone lesion on her CT scan did not show up and she has stage III disease by PET criteria.  She initiated therapy with BV plus AVD and is having an excellent clinical response with resolution of her previously palpable adenopathy, normalization of her leukocytosis and thrombocytosis, and overall improvement in her functional status.  She did not have any significant toxicities of therapy and I am extremely pleased by this.  She has decided to have all of her treatments here and we will plan on keeping her here for the duration of her treatments.    Request PET/CT images for my review and comparison for future PET/CT.  Continue BV + AVD with C1 D15 = 11/25/2021  PEGfilgrastim day 4 = 11/28/2021  Repeat PET/CT after 2 cycles of therapy.  Assuming favorable response, she will have a repeat PET/CT at conclusion of treatment.  Plan continues to be for her to be treated with 6 cycles of treatment.  RTC with Lyla Son in 2 weeks in 4 weeks, with me in 6 weeks, or sooner should new or concerning symptoms develop.    I have discussed the diagnosis and treatment plan with the patient and she expresses understanding and wishes to proceed.    I spent 20 minutes in coordination of care and record review and additional 20 minutes of face-to-face time for total of 40 minutes.

## 2021-11-28 ENCOUNTER — Encounter: Admit: 2021-11-28 | Discharge: 2021-11-28 | Payer: BC Managed Care – HMO

## 2021-11-28 DIAGNOSIS — I10 Essential (primary) hypertension: Secondary | ICD-10-CM

## 2021-11-28 DIAGNOSIS — C8178 Other classical Hodgkin lymphoma, lymph nodes of multiple sites: Secondary | ICD-10-CM

## 2021-11-28 MED ORDER — PEGFILGRASTIM-BMEZ 6 MG/0.6 ML SC SYRG
6 mg | Freq: Once | SUBCUTANEOUS | 0 refills | Status: CP
Start: 2021-11-28 — End: ?
  Administered 2021-11-28: 22:00:00 6 mg via SUBCUTANEOUS

## 2021-11-28 NOTE — Patient Instructions
Call Immediately to report the following:  Uncontrolled nausea and/or vomiting, uncontrolled pain, or unusual bleeding.  Temperature of 100.4 F or greater and/or any sign/symptom of infection (redness, warmth, tenderness)  Painful mouth or difficulty swallowing  Red, cracked, or painful hands and/or feet  Diarrhea   Swelling of arms or legs  Rash    Important Phone Numbers:  [facility] Main Number (answered 24 hours a day) [phone]  Cancer Center Scheduling (appointments) [phone] or [phone]  [facility] (for nutritional supplements) [phone]        Port Maintenance - If you have a port, it should be flushed every 6-8 weeks when not in use.  Please check with your MD, nurse, or the scheduler.

## 2021-11-28 NOTE — Progress Notes
Day 18 Cycle 1 Ziextenzo      Injection given per plan.   Tolerated well.   Discharged in stable condition.

## 2021-12-02 ENCOUNTER — Encounter: Admit: 2021-12-02 | Discharge: 2021-12-02 | Payer: BC Managed Care – HMO

## 2021-12-02 MED ORDER — PANTOPRAZOLE 40 MG PO TBEC
40 mg | ORAL_TABLET | Freq: Every day | ORAL | 1 refills | 90.00000 days | Status: AC
Start: 2021-12-02 — End: ?

## 2021-12-02 MED ORDER — DEXAMETHASONE 4 MG PO TAB
4 mg | ORAL_TABLET | Freq: Every day | ORAL | 0 refills | 12.50000 days | Status: AC
Start: 2021-12-02 — End: ?

## 2021-12-09 ENCOUNTER — Encounter: Admit: 2021-12-09 | Discharge: 2021-12-09 | Payer: BC Managed Care – HMO

## 2021-12-09 DIAGNOSIS — C8178 Other classical Hodgkin lymphoma, lymph nodes of multiple sites: Secondary | ICD-10-CM

## 2021-12-09 DIAGNOSIS — C8170 Other classical Hodgkin lymphoma, unspecified site: Secondary | ICD-10-CM

## 2021-12-09 DIAGNOSIS — I10 Essential (primary) hypertension: Secondary | ICD-10-CM

## 2021-12-09 MED ORDER — BRENTUXIMAB IVPB
1.2 mg/kg | Freq: Once | INTRAVENOUS | 0 refills | Status: CP
Start: 2021-12-09 — End: ?
  Administered 2021-12-09 (×2): 89.4 mg via INTRAVENOUS

## 2021-12-09 MED ORDER — DOXORUBICIN 2 MG/ML IV SOLN
25 mg/m2 | Freq: Once | INTRAVENOUS | 0 refills | Status: CP
Start: 2021-12-09 — End: ?
  Administered 2021-12-09: 16:00:00 46.26 mg via INTRAVENOUS

## 2021-12-09 MED ORDER — VINBLASTINE IVPB
6 mg/m2 | Freq: Once | INTRAVENOUS | 0 refills | Status: CP
Start: 2021-12-09 — End: ?
  Administered 2021-12-09 (×2): 11.1 mg via INTRAVENOUS

## 2021-12-09 MED ORDER — APREPITANT 7.2 MG/ML IV EMUL
130 mg | Freq: Once | INTRAVENOUS | 0 refills | Status: CP
Start: 2021-12-09 — End: ?
  Administered 2021-12-09: 16:00:00 130 mg via INTRAVENOUS

## 2021-12-09 MED ORDER — DEXAMETHASONE 6 MG PO TAB
12 mg | Freq: Once | ORAL | 0 refills | Status: CP
Start: 2021-12-09 — End: ?
  Administered 2021-12-09: 16:00:00 12 mg via ORAL

## 2021-12-09 MED ORDER — PALONOSETRON 0.25 MG/5 ML IV SOLN
.25 mg | Freq: Once | INTRAVENOUS | 0 refills | Status: CP
Start: 2021-12-09 — End: ?
  Administered 2021-12-09: 16:00:00 0.25 mg via INTRAVENOUS

## 2021-12-09 MED ORDER — HEPARIN, PORCINE (PF) 100 UNIT/ML IV SYRG
500 [IU] | Freq: Once | 0 refills | Status: CP
Start: 2021-12-09 — End: ?

## 2021-12-09 MED ORDER — DACARBAZINE IVPB
375 mg/m2 | Freq: Once | INTRAVENOUS | 0 refills | Status: CP
Start: 2021-12-09 — End: ?
  Administered 2021-12-09 (×2): 693.8 mg via INTRAVENOUS

## 2021-12-09 NOTE — Patient Instructions
Call Immediately to report the following:  Uncontrolled nausea and/or vomiting, uncontrolled pain, or unusual bleeding.  Temperature of 100.4 F or greater and/or any sign/symptom of infection (redness, warmth, tenderness)  Painful mouth or difficulty swallowing  Red, cracked, or painful hands and/or feet  Diarrhea   Swelling of arms or legs  Rash    Important Phone Numbers:  [facility] Main Number (answered 24 hours a day) [phone]  Cancer Center Scheduling (appointments) [phone] or [phone]  [facility] (for nutritional supplements) [phone]        Port Maintenance - If you have a port, it should be flushed every 6-8 weeks when not in use.  Please check with your MD, nurse, or the scheduler.

## 2021-12-09 NOTE — Progress Notes
Day 1 Cycle 2 Adriamycin + Vinblastine + Dacarbazine + Brentuximab      Port accessed, labs collected.  Patient to follow up with Morey Hummingbird, NP  OK to treat, labs OK to treat.  Tolerated infusion well.   Port de-accessed per protocol, heparinized.   Discharged in stable condition.         CHEMO NOTE  Verified chemo consent signed and in chart.    Blood return positive via: Port (Single)    BSA and dose double checked (agree with orders as written) with: yes     Labs/applicable tests checked: CBC and Comprehensive Metabolic Panel (CMP)    Chemo regimen: Drug/cycle/day  C2 D1 Ariamycin + Vinblastine + Dacarbazine + Brentuximab    Rate verified and armband double check with second RN: yes, see eMar    Patient education offered and stated understanding. Denies questions at this time.

## 2021-12-09 NOTE — Progress Notes
Name: Mary Terrell          MRN: [id number]      DOB: 1956/06/22      AGE: 66 y.o.   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 female patient of Dr.Hoffmann's with cHL seen today for evaluation, labs and cycle 3 of treatment.    HPI  1.  Presented December 2022 with close to a year of persistent dry cough and chest x-ray showed a mediastinal mass.  2.  CT scans of the chest abdomen pelvis revealed multifocal lymphadenopathy above and below the diaphragm along with splenic lesions and diffuse sclerosis of the L2 vertebral body suggestive of an infiltrative process.  3.  10/12/2021 excisional biopsy of right axillary node revealed classical Hodgkin lymphoma.  4. BV+AVD started 11/11/2021      Interim History 11/14/2021:  [place] opened Wed  Was taking MiraLAX twice daily  Felt bloated  Symptoms started a week after treatment  BMs slowed--went 4 days without a BM  No palpable nodes  Eating and drinking well. Drinking ~50oz water daily.   More tired over weekend, sleeping OK  Endorses nocturia at baseline.   She has not started Protonix  Took dexamethasone for 2 days      Denies fevers, chills, drenching night sweats, unintentional weight loss or new lymphadenopathy.   Denies respiratory symptoms.   No infections, transfusions, or unplanned hospitalizations in the interim.    She is with her husband.     Review of Systems   Constitutional: Positive for fatigue.   HENT:             Respiratory: Negative for cough.    Gastrointestinal: Negative for abdominal pain, constipation, diarrhea, nausea and vomiting.   Genitourinary:        Nocturia Neurological: Negative for numbness.         Objective:         ? allopurinoL (ZYLOPRIM) 300 mg tablet Take one tablet by mouth daily. Take with food.   ? dexAMETHasone (DECADRON) 4 mg tablet Take one tablet by mouth daily. Take with food.   ? losartan-hydroCHLOROthiazide (HYZAAR) 50-12.5 mg tablet Take one tablet by mouth every morning.   ? ondansetron HCL (ZOFRAN) 8 mg tablet Take one tablet by mouth every 8 hours as needed for Nausea or Vomiting.   ? pantoprazole DR (PROTONIX) 40 mg tablet Take one tablet by mouth daily.     Vitals:    12/09/21 0924   BP: 138/74   BP Source: Arm, Left Upper   Pulse: 82   Temp: 36.8 ?C (98.2 ?F)   Resp: 16   SpO2: 100%   TempSrc: Oral   PainSc: Zero   Weight: 68.8 kg (151 lb 9.6 oz)     Body mass index is 25.23 kg/m?Marland Kitchen     Pain Score: Zero         Pain Addressed:  N/[place] Cooperative Oncology Group performance status is  0, Fully active, able to carry on all pre-disease performance without restriction.Marland Kitchen     Physical Exam  Vitals reviewed.   Cardiovascular:      Arteriovenous access: left arteriovenous access is present.     Comments: Left chest PAC, well healed  Pulmonary:      Effort: Pulmonary effort is normal.   Lymphadenopathy:      Upper Body:      Right upper body: No supraclavicular or axillary adenopathy.      Left upper body: No supraclavicular or axillary adenopathy.      Lower Body: No right inguinal adenopathy. No left inguinal adenopathy.   Neurological:      General: No focal deficit present.      Mental Status: She is oriented to person, place, and time.   Psychiatric:         Mood and Affect: Mood normal.         Behavior: Behavior normal.            CBC w diff  CBC with Diff Latest Ref Rng & Units 12/09/2021 11/25/2021 11/14/2021 11/11/2021 11/04/2021   WBC 4.5 - 11.0 K/UL 25.6(H) 19.8(H) 5.8 16.1(H) 12.9(H)   RBC 4.0 - 5.0 M/UL 4.43 4.34 4.23 4.11 4.29   HGB 12.0 - 15.0 GM/DL 11.9(L) 11.3(L) 10.9(L) 10.6(L) 11.1(L)   HCT 36 - 45 % 37.0 36.2 34.3(L) 33.4(L) 34.9(L) MCV 80 - 100 FL 83.4 83.4 81.0 81.3 81.5   MCH 26 - 34 PG 26.8 25.9(L) 25.6(L) 25.8(L) 25.8(L)   MCHC 32.0 - 36.0 G/DL 54.0 31.1(L) 31.7(L) 31.8(L) 31.7(L)   RDW 11 - 15 % 20.9(H) 16.7(H) 15.8(H) 15.9(H) 15.8(H)   PLT 150 - 400 K/UL 303 202 244 428(H) 440(H)   MPV 7 - 11 FL 6.8(L) 7.4 7.6 7.2 7.5   NEUT 41 - 77 % - - 83(H) 80(H) 84(H)   ANC 1.8 - 7.0 K/UL - - 4.90 12.80(H) 10.70(H)   LYMA 24 - 44 % - - 12(L) 8(L) 7(L)   ALYM 1.0 - 4.8 K/UL - - 0.70(L) 1.30 0.90(L)   MONA 4 - 12 % - - 2(L) 10 8   AMONO 0 - 0.80 K/UL - - 0.10 1.60(H) 1.00(H)   EOSA 0 - 5 % - - 2 2 1    AEOS 0 - 0.45 K/UL - - 0.10 0.40 0.20   BASA 0 - 2 % - - 1 0 0   ABAS 0 - 0.20 K/UL - - 0.00 0.10 0.10     Comprehensive Metabolic Profile  CMP Latest Ref Rng & Units 11/25/2021 11/14/2021 11/11/2021 11/04/2021   NA 137 - 147 MMOL/L 143 138 140 139   K 3.5 - 5.1 MMOL/L 3.9 3.6 3.7 3.8   CL 98 - 110 MMOL/L 106 101 101 100   CO2 21 - 30 MMOL/L 28 28 27 25    GAP 3 - 12 9 9 12  14(H)   BUN 7 - 25 MG/DL 15 18 18 17    CR 0.4 - 1.00 MG/DL 9.81 1.91 4.78 2.95   GLUX 70 - 100 MG/DL 91 621(H) 08(M) 578(I)   CA 8.5 - 10.6 MG/DL 9.5 9.1 9.6 9.3   TP 6.0 - 8.0 G/DL 6.9 6.6 7.3 7.3   ALB 3.5 - 5.0 G/DL 3.9 6.9(G) 3.7 3.7   ALKP 25 - 110 U/L 109 84 88 91   ALT 7 - 56 U/L 22 15 11 12    TBILI 0.3 - 1.2 MG/DL 0.4 0.5 0.6  0.6          Assessment and Plan:     1. Advanced stage classical Hodgkin lymphoma, stage III.  She initiated brentuximab with AVD on 2/17 with pegfilgrastim support today-2/20.  Obtained a good clinical response to treatment.  Overall she is tolerating it without trouble except constipation/abdominal pain.  She will proceed today with cycle 3-day 1 of brentuximab with AVD.  She will follow-up with me and Dr. Mikey Bussing as scheduled.  2. Repeat PET/CT after 2 cycles of therapy.   3. Her main complaint after her last treatment was abdominal pain and bloating.  She did not have a bowel movement for 4 days.  After initiating MiraLAX twice daily she started having bowel movements and pain subsided.  I recommended taking MiraLAX twice daily unless her stools become soft/loose.  4. She has dexamethasone, 4 mg to take x2 days if she has abdominal pain.   5. I recommended Protonix daily.  6. She will discontinue allopurinol.  Her electrolytes have been stable.  She has been drinking well.  7. Continue Claritin 10 mg x 5 days starting on the day of pegfilgrastim with each cycle.     ?

## 2021-12-12 ENCOUNTER — Encounter: Admit: 2021-12-12 | Discharge: 2021-12-12 | Payer: BC Managed Care – HMO

## 2021-12-12 DIAGNOSIS — I10 Essential (primary) hypertension: Secondary | ICD-10-CM

## 2021-12-12 DIAGNOSIS — C8178 Other classical Hodgkin lymphoma, lymph nodes of multiple sites: Secondary | ICD-10-CM

## 2021-12-12 MED ORDER — PEGFILGRASTIM-BMEZ 6 MG/0.6 ML SC SYRG
6 mg | Freq: Once | SUBCUTANEOUS | 0 refills | Status: CP
Start: 2021-12-12 — End: ?
  Administered 2021-12-12: 20:00:00 6 mg via SUBCUTANEOUS

## 2021-12-12 NOTE — Progress Notes
Day 3 Cycle 2 Zierxtenzo      Injection given per plan.   Tolerated well.   Discharged in stable condition.

## 2021-12-12 NOTE — Patient Instructions
Call Immediately to report the following:  Uncontrolled nausea and/or vomiting, uncontrolled pain, or unusual bleeding.  Temperature of 100.4 F or greater and/or any sign/symptom of infection (redness, warmth, tenderness)  Painful mouth or difficulty swallowing  Red, cracked, or painful hands and/or feet  Diarrhea   Swelling of arms or legs  Rash    Important Phone Numbers:  [facility] Main Number (answered 24 hours a day) [phone]  Cancer Center Scheduling (appointments) [phone] or [phone]  [facility] (for nutritional supplements) [phone]        Port Maintenance - If you have a port, it should be flushed every 6-8 weeks when not in use.  Please check with your MD, nurse, or the scheduler.

## 2021-12-23 ENCOUNTER — Encounter: Admit: 2021-12-23 | Discharge: 2021-12-23 | Payer: BC Managed Care – HMO

## 2021-12-23 DIAGNOSIS — C8170 Other classical Hodgkin lymphoma, unspecified site: Secondary | ICD-10-CM

## 2021-12-23 DIAGNOSIS — C8178 Other classical Hodgkin lymphoma, lymph nodes of multiple sites: Secondary | ICD-10-CM

## 2021-12-23 DIAGNOSIS — R1084 Generalized abdominal pain: Secondary | ICD-10-CM

## 2021-12-23 DIAGNOSIS — I10 Essential (primary) hypertension: Secondary | ICD-10-CM

## 2021-12-23 MED ORDER — DACARBAZINE IVPB
375 mg/m2 | Freq: Once | INTRAVENOUS | 0 refills
Start: 2021-12-23 — End: ?

## 2021-12-23 MED ORDER — DOXORUBICIN 2 MG/ML IV SOLN
25 mg/m2 | Freq: Once | INTRAVENOUS | 0 refills
Start: 2021-12-23 — End: ?

## 2021-12-23 MED ORDER — DEXAMETHASONE 6 MG PO TAB
12 mg | Freq: Once | ORAL | 0 refills
Start: 2021-12-23 — End: ?

## 2021-12-23 MED ORDER — PALONOSETRON 0.25 MG/5 ML IV SOLN
.25 mg | Freq: Once | INTRAVENOUS | 0 refills | Status: CP
Start: 2021-12-23 — End: ?
  Administered 2021-12-23: 17:00:00 0.25 mg via INTRAVENOUS

## 2021-12-23 MED ORDER — PALONOSETRON 0.25 MG/5 ML IV SOLN
.25 mg | Freq: Once | INTRAVENOUS | 0 refills
Start: 2021-12-23 — End: ?

## 2021-12-23 MED ORDER — VINBLASTINE IVPB
6 mg/m2 | Freq: Once | INTRAVENOUS | 0 refills
Start: 2021-12-23 — End: ?

## 2021-12-23 MED ORDER — APREPITANT 7.2 MG/ML IV EMUL
130 mg | Freq: Once | INTRAVENOUS | 0 refills
Start: 2021-12-23 — End: ?

## 2021-12-23 MED ORDER — DACARBAZINE IVPB
375 mg/m2 | Freq: Once | INTRAVENOUS | 0 refills | Status: CP
Start: 2021-12-23 — End: ?
  Administered 2021-12-23 (×2): 693.8 mg via INTRAVENOUS

## 2021-12-23 MED ORDER — BRENTUXIMAB IVPB
1.2 mg/kg | Freq: Once | INTRAVENOUS | 0 refills
Start: 2021-12-23 — End: ?

## 2021-12-23 MED ORDER — VINBLASTINE IVPB
6 mg/m2 | Freq: Once | INTRAVENOUS | 0 refills | Status: CP
Start: 2021-12-23 — End: ?
  Administered 2021-12-23 (×2): 11.1 mg via INTRAVENOUS

## 2021-12-23 MED ORDER — BRENTUXIMAB IVPB
1.2 mg/kg | Freq: Once | INTRAVENOUS | 0 refills | Status: CP
Start: 2021-12-23 — End: ?
  Administered 2021-12-23 (×2): 79.08 mg via INTRAVENOUS

## 2021-12-23 MED ORDER — DOXORUBICIN 2 MG/ML IV SOLN
25 mg/m2 | Freq: Once | INTRAVENOUS | 0 refills | Status: CP
Start: 2021-12-23 — End: ?
  Administered 2021-12-23: 18:00:00 46.26 mg via INTRAVENOUS

## 2021-12-23 MED ORDER — BRENTUXIMAB IVPB
1.2 mg/kg | Freq: Once | INTRAVENOUS | 0 refills | Status: DC
Start: 2021-12-23 — End: 2021-12-23

## 2021-12-23 MED ORDER — DICYCLOMINE 10 MG PO CAP
10 mg | ORAL_CAPSULE | Freq: Four times a day (QID) | ORAL | 1 refills | Status: AC
Start: 2021-12-23 — End: ?

## 2021-12-23 MED ORDER — DEXAMETHASONE 6 MG PO TAB
12 mg | Freq: Once | ORAL | 0 refills | Status: CP
Start: 2021-12-23 — End: ?
  Administered 2021-12-23: 17:00:00 12 mg via ORAL

## 2021-12-23 MED ORDER — APREPITANT 7.2 MG/ML IV EMUL
130 mg | Freq: Once | INTRAVENOUS | 0 refills | Status: CP
Start: 2021-12-23 — End: ?
  Administered 2021-12-23: 17:00:00 130 mg via INTRAVENOUS

## 2021-12-23 NOTE — Progress Notes
Name: Mary Terrell          MRN: [id number]      DOB: [birth year]      AGE: 66 y.o.   DATE OF SERVICE: 12/23/2021    Subjective:             Reason for Visit:  Follow Up      Mary Terrell is a 66 y.o. female.     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 female patient of Dr.Hoffmann's with cHL seen today for evaluation, labs and cycle 2 day 15 of treatment.  ?  HPI  1. ?Presented December 2022 with close to a year of persistent dry cough and chest x-ray showed a mediastinal mass.  2. ?CT scans of the chest abdomen pelvis revealed multifocal lymphadenopathy above and below the diaphragm along with splenic lesions and diffuse sclerosis of the L2 vertebral body suggestive of an infiltrative process.  3. ?10/12/2021 excisional biopsy of right axillary node revealed classical Hodgkin lymphoma.  4. BV+AVD started 11/11/2021  ?  Subjective  Miralax bid  Normal stools  Urinating in good amount  Abdomen pain persists  Losing weight  Trying to eat-bananas, yogurts, broth  Progressively gets worse through day  Usually better in the morning  Able to work until later afternoon starts to irritate her more  She has tried ibuprofen and Tylenol and we have also done a couple days of dexamethasone  These have not helped  No nausea  PN in fingertips  Bland diet       Review of Systems      Objective:         ? allopurinoL (ZYLOPRIM) 300 mg tablet Take one tablet by mouth daily. Take with food.   ? dexAMETHasone (DECADRON) 4 mg tablet Take one tablet by mouth daily. Take with food.   ? losartan-hydroCHLOROthiazide (HYZAAR) 50-12.5 mg tablet Take one tablet by mouth every morning.   ? ondansetron HCL (ZOFRAN) 8 mg tablet Take one tablet by mouth every 8 hours as needed for Nausea or Vomiting.   ? pantoprazole DR (PROTONIX) 40 mg tablet Take one tablet by mouth daily.     Vitals:    12/23/21 1049 12/23/21 1052   BP: 120/68    BP Source: Arm, Left Upper    Pulse: 94    Temp: 37.2 ?C (98.9 ?F)    Resp: 16    SpO2: 98%    O2 Device:  None (Room air)   TempSrc: Oral Oral   PainSc: Nine    Weight: 65.9 kg (145 lb 3.2 oz)      Body mass index is 24.16 kg/m?Marland Kitchen     Pain Score: Nine  Pain Loc: Abdomen (stomach pain for past 2 weeks. Not bad to day)      Pain Addressed:  Prescription provided for pain management and Current regimen working to control pain.  [place] Cooperative Oncology Group performance status is 1, Restricted in physically strenuous activity but ambulatory and able to carry out work of a light or sedentary nature, e.g., light house work, office work.     Physical Exam  Vitals reviewed.   Constitutional:  Appearance: She is well-developed.   HENT:      Head: Normocephalic.   Cardiovascular:      Rate and Rhythm: Normal rate and regular rhythm.   Pulmonary:      Effort: Pulmonary effort is normal.      Breath sounds: Normal breath sounds.   Abdominal:      General: There is no distension.      Palpations: Abdomen is soft. There is no mass.      Tenderness: There is no abdominal tenderness. There is no guarding.   Musculoskeletal:         General: Normal range of motion.      Cervical back: Normal range of motion.   Lymphadenopathy:      Cervical: No cervical adenopathy.      Upper Body:      Right upper body: No supraclavicular or axillary adenopathy.      Left upper body: No supraclavicular or axillary adenopathy.   Skin:     General: Skin is warm and dry.   Neurological:      Mental Status: She is alert and oriented to person, place, and time.            CBC w/Diff    Lab Results   Component Value Date/Time    WBC 26.3 (H) 12/23/2021 10:41 AM    RBC 4.19 12/23/2021 10:41 AM    HGB 11.3 (L) 12/23/2021 10:41 AM    HCT 35.2 (L) 12/23/2021 10:41 AM    MCV 84.1 12/23/2021 10:41 AM    MCH 27.1 12/23/2021 10:41 AM    MCHC 32.2 12/23/2021 10:41 AM    RDW 23.7 (H) 12/23/2021 10:41 AM    PLTCT 279 12/23/2021 10:41 AM    MPV 7.0 12/23/2021 10:41 AM    Lab Results   Component Value Date/Time    NEUT 83 (H) 11/14/2021 03:17 PM    ANC 17.67 (H) 12/09/2021 09:00 AM    ANC 4.90 11/14/2021 03:17 PM    LYMA 12 (L) 11/14/2021 03:17 PM    ALC 0.70 (L) 11/14/2021 03:17 PM    MONA 2 (L) 11/14/2021 03:17 PM    AMC 0.10 11/14/2021 03:17 PM    EOSA 2 11/14/2021 03:17 PM    AEC 0.10 11/14/2021 03:17 PM    BASA 1 11/14/2021 03:17 PM    ABC 0.00 11/14/2021 03:17 PM             Assessment and Plan:         1. Advanced stage classical Hodgkin lymphoma, stage III.  She initiated brentuximab with AVD on 2/17. She has obtained a good clinical response to treatment.  Overall she is tolerating it without trouble except abdominal pain.  She will proceed today with cycle 3-day 15 of brentuximab with AVD.  She will follow-up with me and Dr. Mikey Bussing as scheduled.  2. Due to weight loss her brentuximab dose will be adjusted.  Discussed with pharmacy today. This may help abdominal symptoms.   3. Repeat PET/CT after 2 cycles of therapy--scheduled for 01/04/2022  4. Her main complaint after treatments was abdominal pain.  She has experienced constipation and bloating after prior treatments but those symptoms have improved with MiraLAX.  Her stools are normal.  There are no specific triggers for abdominal pain but is fairly constant--worsening throughout the day.  She has tried Tylenol, ibuprofen and a couple day of steroids.  I sent a prescription for Bentyl to see if this would help.  5. No nausea or vomiting.  She has lost weight due to taste changes and decrease in appetite.  6. She has been taking Protonix daily.  7. Potassium was 3.2 a couple weeks ago.  CMP drawn today.  We discussed potassium rich foods.  8. Continue Claritin 10 mg x 5 days starting on the day of pegfilgrastim with each cycle.   She denies bone pain.  ?  ?

## 2021-12-23 NOTE — Progress Notes
CHEMO NOTE  Verified chemo consent signed and in chart.    Blood return positive via: Port (Single)    BSA and dose double checked (agree with orders as written) with: yes, see MAR.     Labs/applicable tests checked: CBC and Comprehensive Metabolic Panel (CMP)    Chemo regimen: Day 15, Cycle 2; Adriamycin, Velban, DTIC-DOME, ADCETRIS    Rate verified and armband double check with second RN: yes    Patient education offered and stated understanding. Denies questions at this time.    Patient arrived for treatment. Port accessed, flushed with good blood return. Port accessed, flushed with good blood return. Port labs obtained and patient seen by provider. Patient okay to treat. Pre-medication given then infusions. Patient tolerated treatments today and denied any questions or concerns. Port flushed, and de-accessed. Patient discharged ambulatory.

## 2021-12-26 ENCOUNTER — Encounter: Admit: 2021-12-26 | Discharge: 2021-12-26 | Payer: BC Managed Care – HMO

## 2021-12-26 DIAGNOSIS — I10 Essential (primary) hypertension: Secondary | ICD-10-CM

## 2021-12-26 DIAGNOSIS — C8178 Other classical Hodgkin lymphoma, lymph nodes of multiple sites: Secondary | ICD-10-CM

## 2021-12-26 MED ORDER — PEGFILGRASTIM-BMEZ 6 MG/0.6 ML SC SYRG
6 mg | Freq: Once | SUBCUTANEOUS | 0 refills | Status: CP
Start: 2021-12-26 — End: ?
  Administered 2021-12-26: 20:00:00 6 mg via SUBCUTANEOUS

## 2021-12-26 NOTE — Progress Notes
Patient presented to clinic for ziextenzo injection. Patient tolerated injection well in abdominal tissue. Patient discharged off unit in stable condition.

## 2022-01-04 ENCOUNTER — Encounter: Admit: 2022-01-04 | Discharge: 2022-01-04 | Payer: BC Managed Care – HMO

## 2022-01-04 DIAGNOSIS — C8178 Other classical Hodgkin lymphoma, lymph nodes of multiple sites: Secondary | ICD-10-CM

## 2022-01-04 LAB — POC GLUCOSE: POC GLUCOSE: 129 mg/dL — ABNORMAL HIGH (ref 70–100)

## 2022-01-04 MED ORDER — RP DX F-18 FDG MCI
10 | Freq: Once | INTRAVENOUS | 0 refills | Status: CP
Start: 2022-01-04 — End: ?
  Administered 2022-01-04: 16:00:00 11.7 via INTRAVENOUS

## 2022-01-06 ENCOUNTER — Encounter: Admit: 2022-01-06 | Discharge: 2022-01-06 | Payer: BC Managed Care – HMO

## 2022-01-06 DIAGNOSIS — C8178 Other classical Hodgkin lymphoma, lymph nodes of multiple sites: Secondary | ICD-10-CM

## 2022-01-06 DIAGNOSIS — R0602 Shortness of breath: Secondary | ICD-10-CM

## 2022-01-06 DIAGNOSIS — I10 Essential (primary) hypertension: Secondary | ICD-10-CM

## 2022-01-06 LAB — LDH-LACTATE DEHYDROGENASE: LDH: 423 U/L — ABNORMAL HIGH (ref 100–210)

## 2022-01-06 MED ORDER — PALONOSETRON 0.25 MG/5 ML IV SOLN
.25 mg | Freq: Once | INTRAVENOUS | 0 refills | Status: CP
Start: 2022-01-06 — End: ?
  Administered 2022-01-06: 18:00:00 0.25 mg via INTRAVENOUS

## 2022-01-06 MED ORDER — APREPITANT 7.2 MG/ML IV EMUL
130 mg | Freq: Once | INTRAVENOUS | 0 refills | Status: CP
Start: 2022-01-06 — End: ?
  Administered 2022-01-06: 18:00:00 130 mg via INTRAVENOUS

## 2022-01-06 MED ORDER — VINBLASTINE IVPB
6 mg/m2 | Freq: Once | INTRAVENOUS | 0 refills | Status: CP
Start: 2022-01-06 — End: ?
  Administered 2022-01-06 (×2): 10.44 mg via INTRAVENOUS

## 2022-01-06 MED ORDER — ALTEPLASE 2 MG IK SOLR
2 mg | Freq: Once | INTRAMUSCULAR | 0 refills | Status: CP
Start: 2022-01-06 — End: ?
  Administered 2022-01-06: 17:00:00 2 mg via INTRAMUSCULAR

## 2022-01-06 MED ORDER — DEXAMETHASONE 6 MG PO TAB
12 mg | Freq: Once | ORAL | 0 refills | Status: CP
Start: 2022-01-06 — End: ?
  Administered 2022-01-06: 18:00:00 12 mg via ORAL

## 2022-01-06 MED ORDER — DOXORUBICIN 2 MG/ML IV SOLN
25 mg/m2 | Freq: Once | INTRAVENOUS | 0 refills | Status: CP
Start: 2022-01-06 — End: ?
  Administered 2022-01-06: 19:00:00 43.5 mg via INTRAVENOUS

## 2022-01-06 MED ORDER — DACARBAZINE IVPB
375 mg/m2 | Freq: Once | INTRAVENOUS | 0 refills | Status: CP
Start: 2022-01-06 — End: ?
  Administered 2022-01-06 (×2): 652.5 mg via INTRAVENOUS

## 2022-01-06 NOTE — Progress Notes
Name: Mary Terrell          MRN: [id number]      DOB: [birth year]      AGE: 66 y.o.   DATE OF SERVICE: 01/06/2022    Subjective:             Reason for Visit:  Follow Up      Mary Terrell is a 66 y.o. female.      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 Donnajean Terrell.    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  Presented December 2022 with close to a year of persistent dry cough and chest x-ray showed a mediastinal mass.  2.  CT scans of the chest abdomen pelvis revealed multifocal lymphadenopathy above and below the diaphragm along with splenic lesions and diffuse sclerosis of the L2 vertebral body suggestive of an infiltrative process.  3.  10/12/2021 excisional biopsy of right axillary node revealed classical Hodgkin lymphoma.  4.  11/03/2021 PET/CT revealed multifocal lymphadenopathy above and below the diaphragm consistent with stage III disease.  The L2 lesion was not noted hypermetabolic.  5.  BV + AVD initiated with CR (5 PS = 2) on interim PET/CT    Interim History:  From a lymphoma standpoint, her lymphadenopathy remains resolved and she has had significant improvement in her lymphoma related symptoms and starting chemotherapy.  She does note progressive exertional dyspnea, particular with stairs, and a persistent cough.  She is not tachypneic breathing with a respiratory rate of 20-22 at rest in the exam room.  She does not note any significant wheeze.  She has not had any fevers and does not feel systemically ill.  She did have some abdominal discomfort and the Bentyl has helped immensely with this.  No interim hospitalizations or transfusions.  Denies fevers, drenching night sweats, or unintentional weight loss.    I have reviewed and updated the past medical, social and family histories in the history section and they are up to date as of this visit.    I have extensively reviewed the laboratory, pathology and radiology, both internal and external, and the key findings are summarized above.           Review of Systems   Constitutional: Negative.    HENT: Negative.    Eyes: Negative.    Respiratory: Positive for cough.    Cardiovascular: Negative.    Gastrointestinal: Negative.    Endocrine: Negative.    Genitourinary: Negative.    Musculoskeletal: Negative.    Skin: Negative.    Allergic/Immunologic: Negative.    Neurological: Negative.    Hematological: Negative.    Psychiatric/Behavioral: Negative.          Objective:         ? allopurinoL (ZYLOPRIM) 300 mg tablet Take one tablet by mouth daily. Take with food.   ? dexAMETHasone (DECADRON) 4 mg tablet Take one tablet by mouth daily. Take with food.   ? dicyclomine (BENTYL) 10 mg capsule Take one capsule by mouth four times daily.   ? losartan-hydroCHLOROthiazide (HYZAAR) 50-12.5 mg tablet Take one tablet by mouth  every morning.   ? ondansetron HCL (ZOFRAN) 8 mg tablet Take one tablet by mouth every 8 hours as needed for Nausea or Vomiting.   ? pantoprazole DR (PROTONIX) 40 mg tablet Take one tablet by mouth daily.     Vitals:    01/06/22 1025   BP: 94/68   BP Source: Arm, Right Upper   Pulse: 92   Temp: 37.4 ?C (99.3 ?F)   Resp: 18   SpO2: 97%   TempSrc: Temporal   PainSc: Zero   Weight: 61.9 kg (136 lb 6.4 oz)   Height: 167.6 cm (5' 6)     Body mass index is 22.02 kg/m?Marland Kitchen     Pain Score: Zero       Fatigue Scale: 8    Pain Addressed:  N/A    Patient Evaluated for a Clinical Trial: Patient not eligible for a treatment trial (including not needing treatment, needs palliative care, in remission).     [place] Cooperative Oncology Group performance status is 1, Restricted in physically strenuous activity but ambulatory and able to carry out work of a light or sedentary nature, e.g., light house work, office work.     Physical Exam  Vitals and nursing note reviewed.   Constitutional:       General: She is not in acute distress.     Appearance: She is well-developed.   HENT:      Head: Normocephalic.   Eyes:      General: No scleral icterus.     Conjunctiva/sclera: Conjunctivae normal.   Cardiovascular:      Rate and Rhythm: Normal rate and regular rhythm.   Pulmonary:      Effort: Pulmonary effort is normal.      Breath sounds: Normal breath sounds.   Abdominal:      Palpations: Abdomen is soft.   Musculoskeletal:      Cervical back: Neck supple.   Lymphadenopathy:      Comments:   Cervical and supraclavicular nodes resolved   Skin:     Findings: No rash.   Neurological:      Mental Status: She is alert.               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 Stage III classical Hodgkin lymphoma  2. Hypertension  3. Hyperlipidemia  4. ECOG PS 1    Plan:  Interim PET/CT reviewed personally and shows a CR. This is excellent news and corroborates her excellent clinical response to therapy.    She unfortunately has developed progressive exertional dyspnea and has extensive new bilateral pulmonary infiltrates on the CT portions of her PET/CT imaging that are highly suspicious for drug-induced lung injury.  This would be the diagnosis of exclusion and infectious causes will need to be investigated.  She is not clinically ill and I think that a bacterial pneumonia is highly unlikely but we could be seeing early evidence of pneumocystis or an atypical fungal infection.  Check urine histoplasmosis antigen  Check Fungitell  Check PFTs  Refer to pulmonary.  I have discussed her case personally with Dr. Ranae Terrell.  I additionally let Mary Terrell know that there is a possibility she would require a bronchoscopy for evaluation depending upon pulmonary evaluation.  Hold brentuximab vedotin until pulmonary referral can take place.  Continue AVD with C3 D1 = 01/06/2022.  Brentuximab vedotin will be held per above.  PEGfilgrastim day 3 =  01/08/2022  Repeat PET/CT at conclusion of therapy.  I do suspect that she will need a repeat CT chest in 4 weeks depending upon her clinical evolution and pulmonary opinion.  RTC with me in 2 weeks, or sooner should new or concerning symptoms develop.    I have discussed the diagnosis and treatment plan with the patient and she expresses understanding and wishes to proceed.

## 2022-01-06 NOTE — Progress Notes
Pt arrived ambulatory, here for scheduled tx.   Port accessed without difficulty, was able to flush but unable to get blood return.   Altaplase instilled twice to get blood return. Labs drawn as per order.   Pt seen by Dr. Bryna Colander today, ok to treat.     Premeds given as per plan.     CHEMO NOTE  Verified chemo consent signed and in chart.    Blood return positive via: Port (single)    BSA and dose double checked (agree with orders as written): Beth RN    Labs/applicable tests checked: cbc, cmp, EF    Chemo regimen: Adriamycin+Vinblastine+Dacarbazine cycle 3 day 1     Rate verified and armband double check with second RN: see EMAR    Patient education offered and stated understanding. Denies questions at this time.      Pt tolerated infusion well, port flushed per protocol, port de-accessed. Pt discharged ambulatory in stable condition.

## 2022-01-09 ENCOUNTER — Encounter: Admit: 2022-01-09 | Discharge: 2022-01-09 | Payer: BC Managed Care – HMO

## 2022-01-09 DIAGNOSIS — I10 Essential (primary) hypertension: Secondary | ICD-10-CM

## 2022-01-09 DIAGNOSIS — C8178 Other classical Hodgkin lymphoma, lymph nodes of multiple sites: Secondary | ICD-10-CM

## 2022-01-09 MED ORDER — PEGFILGRASTIM-BMEZ 6 MG/0.6 ML SC SYRG
6 mg | Freq: Once | SUBCUTANEOUS | 0 refills | Status: CP
Start: 2022-01-09 — End: ?
  Administered 2022-01-09: 20:00:00 6 mg via SUBCUTANEOUS

## 2022-01-09 NOTE — Progress Notes
Patient presented to clinic for ziextenzo injection. Patient tolerated injection well in abdominal tissue. Patient discharged off unit in stable condition.

## 2022-01-12 ENCOUNTER — Encounter: Admit: 2022-01-12 | Discharge: 2022-01-12 | Payer: BC Managed Care – HMO

## 2022-01-12 ENCOUNTER — Ambulatory Visit: Admit: 2022-01-12 | Discharge: 2022-01-13 | Payer: BC Managed Care – HMO

## 2022-01-12 ENCOUNTER — Ambulatory Visit: Admit: 2022-01-12 | Discharge: 2022-01-12 | Payer: BC Managed Care – HMO

## 2022-01-12 DIAGNOSIS — C8190 Hodgkin lymphoma, unspecified, unspecified site: Secondary | ICD-10-CM

## 2022-01-12 DIAGNOSIS — R918 Other nonspecific abnormal finding of lung field: Secondary | ICD-10-CM

## 2022-01-12 DIAGNOSIS — I10 Essential (primary) hypertension: Secondary | ICD-10-CM

## 2022-01-12 MED ORDER — LACTATED RINGERS IV SOLP
INTRAVENOUS | 0 refills
Start: 2022-01-12 — End: ?

## 2022-01-12 NOTE — Telephone Encounter
Spoke with patient regarding pulmonary procedure, COVID testing, pre-procedure requirements and instructions.  All information that we have gone over today will also be sent to patient via their MyChart account. Patient understands that they will be able to access this information which will also have my telephone number should they have any further questions or concerns, as well as contacting me via a MyChart message.  Patient has been informed that mandatory COVID testing is no longer required prior to having the procedure.  Patient has been informed of the risk factors of not having the COVID test prior to the procedure and verbalizes knowledge of these risk factors.  Patient  has verbalized understanding of all instructions given to them today and all questions have been answered. Patient is scheduled for Bronch w/ BAL on 01/16/22 at 43 with Dr. Loni Muse .  Vonzell Schlatter, RN

## 2022-01-12 NOTE — Telephone Encounter
Left message for patient to call back to schedule procedure.  Will await call from patient.   Harshitha Fretz, RN

## 2022-01-13 DIAGNOSIS — C8178 Other classical Hodgkin lymphoma, lymph nodes of multiple sites: Secondary | ICD-10-CM

## 2022-01-13 DIAGNOSIS — R0602 Shortness of breath: Secondary | ICD-10-CM

## 2022-01-15 ENCOUNTER — Encounter: Admit: 2022-01-15 | Discharge: 2022-01-15 | Payer: BC Managed Care – HMO

## 2022-01-16 ENCOUNTER — Encounter: Admit: 2022-01-16 | Discharge: 2022-01-16 | Payer: BC Managed Care – HMO

## 2022-01-16 ENCOUNTER — Ambulatory Visit: Admit: 2022-01-16 | Discharge: 2022-01-16 | Payer: BC Managed Care – HMO

## 2022-01-16 DIAGNOSIS — I10 Essential (primary) hypertension: Secondary | ICD-10-CM

## 2022-01-16 DIAGNOSIS — C8190 Hodgkin lymphoma, unspecified, unspecified site: Secondary | ICD-10-CM

## 2022-01-16 MED ORDER — FENTANYL CITRATE (PF) 50 MCG/ML IJ SOLN
INTRAVENOUS | 0 refills | Status: DC
Start: 2022-01-16 — End: 2022-01-16
  Administered 2022-01-16 (×2): 25 ug via INTRAVENOUS

## 2022-01-16 MED ORDER — MIDAZOLAM 1 MG/ML IJ SOLN
INTRAVENOUS | 0 refills | Status: DC
Start: 2022-01-16 — End: 2022-01-16
  Administered 2022-01-16: 19:00:00 2 mg via INTRAVENOUS

## 2022-01-16 MED ORDER — PHENYLEPHRINE HCL IN 0.9% NACL 1 MG/10 ML (100 MCG/ML) IV SYRG
INTRAVENOUS | 0 refills | Status: DC
Start: 2022-01-16 — End: 2022-01-16
  Administered 2022-01-16: 19:00:00 100 ug via INTRAVENOUS

## 2022-01-16 MED ORDER — PROPOFOL 10 MG/ML IV EMUL 20 ML (INFUSION)(AM)(OR)
INTRAVENOUS | 0 refills | Status: DC
Start: 2022-01-16 — End: 2022-01-16
  Administered 2022-01-16: 19:00:00 100 ug/kg/min via INTRAVENOUS

## 2022-01-16 MED ORDER — LIDOCAINE (PF) 20 MG/ML (2 %) IJ SOLN
INTRAVENOUS | 0 refills | Status: DC
Start: 2022-01-16 — End: 2022-01-16
  Administered 2022-01-16: 19:00:00 80 mg via INTRAVENOUS

## 2022-01-16 MED ADMIN — LACTATED RINGERS IV SOLP [4318]: 1000 mL | INTRAVENOUS | @ 19:00:00 | Stop: 2022-01-16 | NDC 00338011704

## 2022-01-16 MED ADMIN — TETRACAINE(#) 0.25%/EPINEPHRINE 0.003% IJ SOLN [210208]: 4 mL | RESPIRATORY_TRACT | @ 19:00:00 | Stop: 2022-01-16 | NDC 54029020572

## 2022-01-18 ENCOUNTER — Encounter: Admit: 2022-01-18 | Discharge: 2022-01-18 | Payer: BC Managed Care – HMO

## 2022-01-18 DIAGNOSIS — C8190 Hodgkin lymphoma, unspecified, unspecified site: Secondary | ICD-10-CM

## 2022-01-18 DIAGNOSIS — I10 Essential (primary) hypertension: Secondary | ICD-10-CM

## 2022-01-19 ENCOUNTER — Encounter: Admit: 2022-01-19 | Discharge: 2022-01-19 | Payer: BC Managed Care – HMO

## 2022-01-20 ENCOUNTER — Encounter: Admit: 2022-01-20 | Discharge: 2022-01-20 | Payer: BC Managed Care – HMO

## 2022-01-20 DIAGNOSIS — C8190 Hodgkin lymphoma, unspecified, unspecified site: Secondary | ICD-10-CM

## 2022-01-20 DIAGNOSIS — C8178 Other classical Hodgkin lymphoma, lymph nodes of multiple sites: Secondary | ICD-10-CM

## 2022-01-20 DIAGNOSIS — I10 Essential (primary) hypertension: Secondary | ICD-10-CM

## 2022-01-20 MED ORDER — APREPITANT 7.2 MG/ML IV EMUL
130 mg | Freq: Once | INTRAVENOUS | 0 refills | Status: CP
Start: 2022-01-20 — End: ?
  Administered 2022-01-20: 16:00:00 130 mg via INTRAVENOUS

## 2022-01-20 MED ORDER — PALONOSETRON 0.25 MG/5 ML IV SOLN
.25 mg | Freq: Once | INTRAVENOUS | 0 refills | Status: CP
Start: 2022-01-20 — End: ?
  Administered 2022-01-20: 16:00:00 0.25 mg via INTRAVENOUS

## 2022-01-20 MED ORDER — DEXAMETHASONE 6 MG PO TAB
12 mg | Freq: Once | ORAL | 0 refills | Status: CP
Start: 2022-01-20 — End: ?
  Administered 2022-01-20: 16:00:00 12 mg via ORAL

## 2022-01-20 MED ORDER — VINBLASTINE IVPB
6 mg/m2 | Freq: Once | INTRAVENOUS | 0 refills | Status: CP
Start: 2022-01-20 — End: ?
  Administered 2022-01-20 (×2): 10.44 mg via INTRAVENOUS

## 2022-01-20 MED ORDER — DACARBAZINE IVPB
375 mg/m2 | Freq: Once | INTRAVENOUS | 0 refills | Status: CP
Start: 2022-01-20 — End: ?
  Administered 2022-01-20 (×2): 652.5 mg via INTRAVENOUS

## 2022-01-20 MED ORDER — ALTEPLASE 2 MG IK SOLR
2 mg | Freq: Once | INTRAMUSCULAR | 0 refills | Status: CP
Start: 2022-01-20 — End: ?
  Administered 2022-01-20: 14:00:00 2 mg via INTRAMUSCULAR

## 2022-01-20 MED ORDER — DOXORUBICIN 2 MG/ML IV SOLN
25 mg/m2 | Freq: Once | INTRAVENOUS | 0 refills | Status: CP
Start: 2022-01-20 — End: ?
  Administered 2022-01-20: 16:00:00 43.5 mg via INTRAVENOUS

## 2022-01-20 NOTE — Progress Notes
Name: Mary Terrell          MRN: [id number]      DOB: [birth year]      AGE: 66 y.o.   DATE OF SERVICE: 01/20/2022    Subjective:             Reason for Visit:  Follow Up      Mary Terrell is a 66 y.o. female.      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 Donnajean Lopes.    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  Presented December 2022 with close to a year of persistent dry cough and chest x-ray showed a mediastinal mass.  2.  CT scans of the chest abdomen pelvis revealed multifocal lymphadenopathy above and below the diaphragm along with splenic lesions and diffuse sclerosis of the L2 vertebral body suggestive of an infiltrative process.  3.  10/12/2021 excisional biopsy of right axillary node revealed classical Hodgkin lymphoma.  4.  11/03/2021 PET/CT revealed multifocal lymphadenopathy above and below the diaphragm consistent with stage III disease.  The L2 lesion was not noted hypermetabolic.  5.  BV + AVD initiated with CR (5 PS = 2) on interim PET/CT    Interim History:  Feels significantly better.  Cough resolved after treatment and is now back a little bit this past week.  It is not severe and remains dry.  Her exertional dyspnea is significantly better.  Lymphadenopathy remains clinically resolved and her constitutional symptoms related to her lymphoma continues to not be problematic.  Continues to use Bentyl for her intermittent abdominal discomfort.  No interim hospitalizations or transfusions.  Denies fevers, drenching night sweats, or unintentional weight loss.    I have reviewed and updated the past medical, social and family histories in the history section and they are up to date as of this visit.    I have extensively reviewed the laboratory, pathology and radiology, both internal and external, and the key findings are summarized above.         Review of Systems   All other systems reviewed and are negative.        Objective:         ? allopurinoL (ZYLOPRIM) 300 mg tablet Take one tablet by mouth daily. Take with food.   ? dexAMETHasone (DECADRON) 4 mg tablet Take one tablet by mouth daily. Take with food.   ? dicyclomine (BENTYL) 10 mg capsule Take one capsule by mouth four times daily.   ? losartan-hydroCHLOROthiazide (HYZAAR) 50-12.5 mg tablet Take one tablet by mouth every morning.   ? ondansetron HCL (ZOFRAN) 8 mg tablet Take one tablet by mouth every 8 hours as needed for Nausea or Vomiting.   ? pantoprazole DR (PROTONIX) 40 mg tablet Take one tablet by mouth daily.     Vitals:    01/20/22 0919   BP: 127/82   BP Source: Arm, Left Upper   Pulse: 86   Temp: 37.1 ?C (98.8 ?F)   Resp: 18   SpO2: 100%   TempSrc: Oral   PainSc: Zero   Weight: 62.3 kg (137 lb  6.4 oz)     Body mass index is 22.18 kg/m?Marland Kitchen     Pain Score: Zero       Fatigue Scale: 6    Pain Addressed:  N/A    Patient Evaluated for a Clinical Trial: Patient not eligible for a treatment trial (including not needing treatment, needs palliative care, in remission).     [place] Cooperative Oncology Group performance status is 1, Restricted in physically strenuous activity but ambulatory and able to carry out work of a light or sedentary nature, e.g., light house work, office work.     Physical Exam  Vitals and nursing note reviewed.   Constitutional:       General: She is not in acute distress.     Appearance: She is well-developed.   HENT:      Head: Normocephalic.   Eyes:      General: No scleral icterus.     Conjunctiva/sclera: Conjunctivae normal.   Cardiovascular:      Rate and Rhythm: Normal rate and regular rhythm.   Pulmonary:      Effort: Pulmonary effort is normal. Breath sounds: Normal breath sounds.   Abdominal:      Palpations: Abdomen is soft.   Musculoskeletal:      Cervical back: Neck supple.   Lymphadenopathy:      Comments:   Cervical and supraclavicular nodes resolved   Skin:     Findings: No rash.   Neurological:      Mental Status: She is alert.               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 Stage III classical Hodgkin lymphoma  2. Hypertension  3. Hyperlipidemia  4. ECOG PS 1    Plan:  Since last visit, she had a comprehensive evaluation with Dr. Ranae Plumber, with whom I have extensively spoken regarding her case.   She had a-positive Fungitell that was dramatically elevated and concerning for possible pneumocystis.  However, her PJP PCR was negative, the cell counts on her bronchoscopy show little to no evidence of significant inflammatory infiltrate, and the overall infectious work-up is largely been negative.  Cytology is pending, and may offer a definitive diagnosis of an infectious process, but assuming that we do not see any clear evidence of infection the diagnosis of exclusion would be drug-induced lung injury.  I have proposed to her that we treat her again today without brentuximab vedotin.  We will repeat a high-resolution CT chest and a Fungitell prior to her next cycle of therapy.  If her infiltrates resolved and the Fungitell normalizes, then I would propose rechallenging her with brentuximab vedotin and closely monitoring her as rechallenge of the drug would confirm drug-induced lung injury and her symptoms were quite mild, not resulting in any significant hypoxemia.  CT chest and Fungitell in 2 weeks per above  Continue AVD with C3 D15 = 01/20/2022.  Brentuximab vedotin will be held per above.  PEGfilgrastim day 3 = 01/23/2022  Repeat PET/CT at conclusion of therapy for therapy assessment.  RTC with me in 2 weeks, or sooner should new or concerning symptoms develop.    I have discussed the diagnosis and treatment plan with the patient and she expresses understanding and wishes to proceed.

## 2022-01-20 NOTE — Progress Notes
Day 15 Cycle 3 Adriamycin + Vinblastine + dacarbazine      Port accessed, [employer], labs collected  Patient to follow up with Dr. Bryna Colander  OK to treat, labs OK to treat.  Tolerated infusion well.   Port turbulently flushed and de-accessed per protocol.  Discharged in stable condition.         CHEMO NOTE  Verified chemo consent signed and in chart.    Blood return positive via: Port (Single)    BSA and dose double checked (agree with orders as written) with: yes     Labs/applicable tests checked: CBC and Comprehensive Metabolic Panel (CMP)    Chemo regimen: Drug/cycle/day  C3 D15 Adriamycin + Vinblastine + dacarbazine    Rate verified and armband double check with second RN: yes, see eMar    Patient education offered and stated understanding. Denies questions at this time.

## 2022-01-20 NOTE — Progress Notes
PATIENT GIVEN AVS 01/20/22 10:38 AM

## 2022-01-20 NOTE — Assessment & Plan Note
Impression:  1. Stage III classical Hodgkin lymphoma  2. Hypertension  3. Hyperlipidemia  4. ECOG PS 1    Plan:  Since last visit, she had a comprehensive evaluation with Dr. Avie Arenas, with whom I have extensively spoken regarding her case.   She had a-positive Fungitell that was dramatically elevated and concerning for possible pneumocystis.  However, her PJP PCR was negative, the cell counts on her bronchoscopy show little to no evidence of significant inflammatory infiltrate, and the overall infectious work-up is largely been negative.  Cytology is pending, and may offer a definitive diagnosis of an infectious process, but assuming that we do not see any clear evidence of infection the diagnosis of exclusion would be drug-induced lung injury.  I have proposed to her that we treat her again today without brentuximab vedotin.  We will repeat a high-resolution CT chest and a Fungitell prior to her next cycle of therapy.  If her infiltrates resolved and the Fungitell normalizes, then I would propose rechallenging her with brentuximab vedotin and closely monitoring her as rechallenge of the drug would confirm drug-induced lung injury and her symptoms were quite mild, not resulting in any significant hypoxemia.  CT chest and Fungitell in 2 weeks per above  Continue AVD with C3 D15 = 01/20/2022.  Brentuximab vedotin will be held per above.  PEGfilgrastim day 3 = 01/23/2022  Repeat PET/CT at conclusion of therapy for therapy assessment.  RTC with me in 2 weeks, or sooner should new or concerning symptoms develop.    I have discussed the diagnosis and treatment plan with the patient and she expresses understanding and wishes to proceed.

## 2022-01-20 NOTE — Patient Instructions
Call Immediately to report the following:  Uncontrolled nausea and/or vomiting, uncontrolled pain, or unusual bleeding.  Temperature of 100.4 F or greater and/or any sign/symptom of infection (redness, warmth, tenderness)  Painful mouth or difficulty swallowing  Red, cracked, or painful hands and/or feet  Diarrhea   Swelling of arms or legs  Rash    Important Phone Numbers:  [facility] Main Number (answered 24 hours a day) [phone]  Cancer Center Scheduling (appointments) [phone] or [phone]  [facility] (for nutritional supplements) [phone]        Port Maintenance - If you have a port, it should be flushed every 6-8 weeks when not in use.  Please check with your MD, nurse, or the scheduler.

## 2022-01-23 ENCOUNTER — Encounter: Admit: 2022-01-23 | Discharge: 2022-01-23 | Payer: BC Managed Care – HMO

## 2022-01-23 DIAGNOSIS — C8178 Other classical Hodgkin lymphoma, lymph nodes of multiple sites: Secondary | ICD-10-CM

## 2022-01-23 MED ORDER — PEGFILGRASTIM-BMEZ 6 MG/0.6 ML SC SYRG
6 mg | Freq: Once | SUBCUTANEOUS | 0 refills | Status: CP
Start: 2022-01-23 — End: ?
  Administered 2022-01-23: 21:00:00 6 mg via SUBCUTANEOUS

## 2022-01-23 NOTE — Progress Notes
Patient presented to clinic for injection. Patient tolerated injection well in abdominal tissue. Patient discharged off unit in stable condition.

## 2022-01-31 ENCOUNTER — Encounter: Admit: 2022-01-31 | Discharge: 2022-01-31 | Payer: BC Managed Care – HMO

## 2022-01-31 DIAGNOSIS — C8178 Other classical Hodgkin lymphoma, lymph nodes of multiple sites: Secondary | ICD-10-CM

## 2022-01-31 MED ORDER — SODIUM CHLORIDE 0.9 % IJ SOLN
50 mL | Freq: Once | INTRAVENOUS | 0 refills | Status: CP
Start: 2022-01-31 — End: ?
  Administered 2022-01-31: 20:00:00 50 mL via INTRAVENOUS

## 2022-01-31 MED ORDER — IOHEXOL 350 MG IODINE/ML IV SOLN
70 mL | Freq: Once | INTRAVENOUS | 0 refills | Status: CP
Start: 2022-01-31 — End: ?
  Administered 2022-01-31: 20:00:00 70 mL via INTRAVENOUS

## 2022-01-31 NOTE — Progress Notes
Name: Mary Terrell          MRN: [id number]      DOB: [birth year]      AGE: 66 y.o.   DATE OF SERVICE: 02/03/2022    Subjective:             Reason for Visit:  Heme/Onc Care      Mary Terrell is a 66 y.o. female.      Cancer Staging   Hodgkin lymphoma of lymph nodes of multiple regions [facility])  Staging form: Hodgkin And Non-Hodgkin Lymphoma, AJCC 8th Edition  - Clinical stage from 11/02/2021: Stage III (Hodgkin lymphoma, A - Asymptomatic) - Signed by Mary Gelinas, MD on 11/25/2021      Mary Terrell presents today for management of Hodgkin lymphoma at the request of Dr. Donnajean Terrell.    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  Presented December 2022 with close to a year of persistent dry cough and chest x-ray showed a mediastinal mass.  2.  CT scans of the chest abdomen pelvis revealed multifocal lymphadenopathy above and below the diaphragm along with splenic lesions and diffuse sclerosis of the L2 vertebral body suggestive of an infiltrative process.  3.  10/12/2021 excisional biopsy of right axillary node revealed classical Hodgkin lymphoma.  4.  11/03/2021 PET/CT revealed multifocal lymphadenopathy above and below the diaphragm consistent with stage III disease.  The L2 lesion was not noted hypermetabolic.  5.  BV + AVD initiated with CR (5 PS = 2) on interim PET/CT.  She did have multifocal pulmonary infiltrates with a negative bronchoscopy and was suspected to have drug-induced lung injury.  Brentuximab vedotin was held after 2 cycles.    Interim History:  Continues to have paroxysmal cough.  This can be particular severe and bothersome.  This being said, her dyspnea remains dramatically improved and she is not having as much cough that she did previously.  No interim fevers.  Lymphadenopathy remains clinically resolved and her constitutional symptoms related to her lymphoma continue to not be problematic.  Continues to use Bentyl for her intermittent abdominal discomfort.  No interim hospitalizations or transfusions.  Denies fevers, drenching night sweats, or unintentional weight loss.    I have reviewed and updated the past medical, social and family histories in the history section and they are up to date as of this visit.    I have extensively reviewed the laboratory, pathology and radiology, both internal and external, and the key findings are summarized above.           Review of Systems   All other systems reviewed and are negative.        Objective:         ? allopurinoL (ZYLOPRIM) 300 mg tablet Take one tablet by mouth daily. Take with food.   ? dexAMETHasone (DECADRON) 4 mg tablet Take one tablet by mouth daily. Take with food.   ? dicyclomine (BENTYL) 10 mg capsule Take one capsule by mouth four times daily.   ? losartan-hydroCHLOROthiazide (HYZAAR) 50-12.5 mg tablet Take one tablet by mouth every morning.   ? ondansetron HCL (ZOFRAN) 8 mg tablet Take one tablet by mouth every 8 hours as needed for Nausea or Vomiting.   ? pantoprazole DR (PROTONIX) 40 mg tablet Take one tablet by mouth daily.     Vitals:    02/03/22 0946   BP: (!) 151/93   BP Source: Arm, Left  Upper   Pulse: 85   Temp: 36.6 ?C (97.8 ?F)   Resp: 16   SpO2: 99%   TempSrc: Oral   PainSc: Zero   Weight: 63.1 kg (139 lb 3.2 oz)     Body mass index is 22.47 kg/m?Marland Kitchen     Pain Score: Zero       Fatigue Scale: 5    Pain Addressed:  N/A    Patient Evaluated for a Clinical Trial: Patient not eligible for a treatment trial (including not needing treatment, needs palliative care, in remission).     [place] Cooperative Oncology Group performance status is 0, Fully active, able to carry on all pre-disease performance without restriction.     Physical Exam  Vitals and nursing note reviewed.   Constitutional:       General: She is not in acute distress.     Appearance: She is well-developed.   HENT:      Head: Normocephalic.   Eyes:      General: No scleral icterus.     Conjunctiva/sclera: Conjunctivae normal.   Cardiovascular:      Rate and Rhythm: Normal rate and regular rhythm.   Pulmonary:      Effort: Pulmonary effort is normal.      Breath sounds: Normal breath sounds.   Abdominal:      Palpations: Abdomen is soft.   Musculoskeletal:      Cervical back: Neck supple.   Lymphadenopathy:      Comments:   Cervical and supraclavicular nodes resolved   Skin:     Findings: No rash.   Neurological:      Mental Status: She is alert.               Assessment and Plan:            Hodgkin lymphoma of lymph nodes of multiple regions [facility])    Impression:  1. Stage III classical Hodgkin lymphoma  2. Hypertension  3. Hyperlipidemia  4. ECOG PS 1    Plan:  Repeat chest CT reviewed personally (see images above) and shows interval dramatic improvement in her multifocal lung infiltrates.  This is excellent news.  Her work-up from her bronchoscopy remains completely negative for infection and there is certainly no evidence that this is related to refractory malignancy.  This means that the less likely possibility is drug-induced lung injury secondary to brentuximab vedotin.  The optimal way to prove that this would be the case would be to rechallenge her with brentuximab vedotin and determine if she has recurrent pulmonary infiltrates.    She continues to have a cough that is rather bothersome and paroxysmal.  I suspect this is due to persistent large airway inflammation and I would not recommend a rechallenge with brentuximab vedotin right now.  She is in agreement with this.  Typically steroids are given for drug-induced lung injury and I would personally be comfortable with giving her a course of steroids if needed.  Neither she nor I feels that her symptoms are severe enough right now to warrant this.  This being said, if she does have worsening pulmonary symptoms, we would likely call her in a short course of steroids.  Follow-up on today's Fungitell.  If it has normalized, no further work-up would be indicated.  If she remains with an elevated Fungitell, I will discuss with Dr. Ranae Plumber and likely make an infectious disease referral.  Continue AVD with C4 D1 = 02/03/2022.  PEGfilgrastim day 3 = 02/05/2022  Repeat CT chest without contrast in 4 weeks.  Repeat PET/CT at conclusion of therapy for therapy assessment.  RTC with Lyla Son in 2 weeks, with me in 4 weeks, or sooner should new or concerning symptoms develop.    I have discussed the diagnosis and treatment plan with the patient and she expresses understanding and wishes to proceed.

## 2022-02-02 ENCOUNTER — Encounter: Admit: 2022-02-02 | Discharge: 2022-02-02 | Payer: BC Managed Care – HMO

## 2022-02-03 ENCOUNTER — Encounter: Admit: 2022-02-03 | Discharge: 2022-02-03 | Payer: BC Managed Care – HMO

## 2022-02-03 DIAGNOSIS — I10 Essential (primary) hypertension: Secondary | ICD-10-CM

## 2022-02-03 DIAGNOSIS — C8190 Hodgkin lymphoma, unspecified, unspecified site: Secondary | ICD-10-CM

## 2022-02-03 DIAGNOSIS — C8178 Other classical Hodgkin lymphoma, lymph nodes of multiple sites: Secondary | ICD-10-CM

## 2022-02-03 MED ORDER — PALONOSETRON 0.25 MG/5 ML IV SOLN
.25 mg | Freq: Once | INTRAVENOUS | 0 refills | Status: CP
Start: 2022-02-03 — End: ?
  Administered 2022-02-03: 16:00:00 0.25 mg via INTRAVENOUS

## 2022-02-03 MED ORDER — DEXAMETHASONE 6 MG PO TAB
12 mg | Freq: Once | ORAL | 0 refills | Status: CP
Start: 2022-02-03 — End: ?
  Administered 2022-02-03: 16:00:00 12 mg via ORAL

## 2022-02-03 MED ORDER — DOXORUBICIN 2 MG/ML IV SOLN
25 mg/m2 | Freq: Once | INTRAVENOUS | 0 refills | Status: CP
Start: 2022-02-03 — End: ?
  Administered 2022-02-03: 16:00:00 43.5 mg via INTRAVENOUS

## 2022-02-03 MED ORDER — DACARBAZINE IVPB
375 mg/m2 | Freq: Once | INTRAVENOUS | 0 refills | Status: CP
Start: 2022-02-03 — End: ?
  Administered 2022-02-03 (×2): 652.5 mg via INTRAVENOUS

## 2022-02-03 MED ORDER — VINBLASTINE IVPB
6 mg/m2 | Freq: Once | INTRAVENOUS | 0 refills | Status: CP
Start: 2022-02-03 — End: ?
  Administered 2022-02-03 (×2): 10.44 mg via INTRAVENOUS

## 2022-02-03 MED ORDER — APREPITANT 7.2 MG/ML IV EMUL
130 mg | Freq: Once | INTRAVENOUS | 0 refills | Status: CP
Start: 2022-02-03 — End: ?
  Administered 2022-02-03: 16:00:00 130 mg via INTRAVENOUS

## 2022-02-03 NOTE — Progress Notes
Pt arrived ambulatory, here for scheduled tx.   Port accessed without difficulty, labs drawn as per order.   Pt seen by Dr. Bryna Colander today, ok to treat.     Premeds given as per plan.     CHEMO NOTE  Verified chemo consent signed and in chart.    Blood return positive via: Port (single)    BSA and dose double checked (agree with orders as written): Beth RN    Labs/applicable tests checked: cbc, cmp, EF    Chemo regimen: Adriamycin+Vinblastine+Dacarbazine cycle 4 day 1     Rate verified and armband double check with second RN: see EMAR    Patient education offered and stated understanding. Denies questions at this time.      Pt tolerated infusion well, port flushed per protocol, port de-accessed. Pt discharged ambulatory in stable condition.

## 2022-02-06 ENCOUNTER — Encounter: Admit: 2022-02-06 | Discharge: 2022-02-06 | Payer: BC Managed Care – HMO

## 2022-02-06 DIAGNOSIS — I10 Essential (primary) hypertension: Secondary | ICD-10-CM

## 2022-02-06 DIAGNOSIS — C8178 Other classical Hodgkin lymphoma, lymph nodes of multiple sites: Secondary | ICD-10-CM

## 2022-02-06 DIAGNOSIS — C8190 Hodgkin lymphoma, unspecified, unspecified site: Secondary | ICD-10-CM

## 2022-02-06 MED ORDER — PEGFILGRASTIM-BMEZ 6 MG/0.6 ML SC SYRG
6 mg | Freq: Once | SUBCUTANEOUS | 0 refills | Status: CP
Start: 2022-02-06 — End: ?
  Administered 2022-02-06: 21:00:00 6 mg via SUBCUTANEOUS

## 2022-02-06 NOTE — Progress Notes
Pt here for cycle 4, day 3 Ziextenzo injection  Pt voices no complaints other than fatigue.  Tolerated tx well  Dismissed amb in satisfactory condition

## 2022-02-17 ENCOUNTER — Encounter: Admit: 2022-02-17 | Discharge: 2022-02-17 | Payer: BC Managed Care – HMO

## 2022-02-17 DIAGNOSIS — C8178 Other classical Hodgkin lymphoma, lymph nodes of multiple sites: Secondary | ICD-10-CM

## 2022-02-17 DIAGNOSIS — I10 Essential (primary) hypertension: Secondary | ICD-10-CM

## 2022-02-17 DIAGNOSIS — C8190 Hodgkin lymphoma, unspecified, unspecified site: Secondary | ICD-10-CM

## 2022-02-17 MED ORDER — APREPITANT 7.2 MG/ML IV EMUL
130 mg | Freq: Once | INTRAVENOUS | 0 refills | Status: CP
Start: 2022-02-17 — End: ?
  Administered 2022-02-17: 15:00:00 130 mg via INTRAVENOUS

## 2022-02-17 MED ORDER — DOXORUBICIN 2 MG/ML IV SOLN
25 mg/m2 | Freq: Once | INTRAVENOUS | 0 refills | Status: CP
Start: 2022-02-17 — End: ?
  Administered 2022-02-17: 15:00:00 43.5 mg via INTRAVENOUS

## 2022-02-17 MED ORDER — DACARBAZINE IVPB
375 mg/m2 | Freq: Once | INTRAVENOUS | 0 refills | Status: CP
Start: 2022-02-17 — End: ?
  Administered 2022-02-17 (×2): 652.5 mg via INTRAVENOUS

## 2022-02-17 MED ORDER — PALONOSETRON 0.25 MG/5 ML IV SOLN
.25 mg | Freq: Once | INTRAVENOUS | 0 refills | Status: CP
Start: 2022-02-17 — End: ?
  Administered 2022-02-17: 15:00:00 0.25 mg via INTRAVENOUS

## 2022-02-17 MED ORDER — DEXAMETHASONE 6 MG PO TAB
12 mg | Freq: Once | ORAL | 0 refills | Status: CP
Start: 2022-02-17 — End: ?
  Administered 2022-02-17: 15:00:00 12 mg via ORAL

## 2022-02-17 MED ORDER — GABAPENTIN 300 MG PO CAP
300 mg | ORAL_CAPSULE | Freq: Every evening | ORAL | 1 refills | Status: AC
Start: 2022-02-17 — End: ?

## 2022-02-17 MED ORDER — VINBLASTINE IVPB
6 mg/m2 | Freq: Once | INTRAVENOUS | 0 refills | Status: CP
Start: 2022-02-17 — End: ?
  Administered 2022-02-17 (×2): 10.44 mg via INTRAVENOUS

## 2022-02-17 NOTE — Progress Notes
CHEMO NOTE  Verified chemo consent signed and in chart.    Blood return positive via: Port (Single)    BSA and dose double checked (agree with orders as written) with: yes, see MAR.     Labs/applicable tests checked: CBC, Comprehensive Metabolic Panel (CMP) and Echocardiogram    Chemo regimen: Day 15, Cycle 4; Adriamycin, Velban, DTIC-DOME    Rate verified and armband double check with second RN: yes    Patient education offered and stated understanding. Denies questions at this time.    Pt arrived for treatment. Port accessed and labs obtained. Pt seen by provider and ok to treat. Pre-medication given and then treatment. Pt tolerated and denied any questions or concerns. Port flushed and de-accessed. Pt discharged ambulatory.

## 2022-02-17 NOTE — Progress Notes
Name: Mary Terrell          MRN: [id number]      DOB: 11/22/1955      AGE: 66 y.o.   DATE OF SERVICE: 02/17/2022    Subjective:             Reason for Visit:  Follow Up      Mary Terrell is a 66 y.o. female.      Cancer Staging   Hodgkin lymphoma of lymph nodes of multiple regions [facility])  Staging form: Hodgkin And Non-Hodgkin Lymphoma, AJCC 8th Edition  - Clinical stage from 11/02/2021: Stage III (Hodgkin lymphoma, A - Asymptomatic) - Signed by [name], MD on 11/25/2021      Mary Terrell presents today for management of Hodgkin lymphoma at the request of Dr. Donnajean Lopes.    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  Presented December 2022 with close to a year of persistent dry cough and chest x-ray showed a mediastinal mass.  2.  CT scans of the chest abdomen pelvis revealed multifocal lymphadenopathy above and below the diaphragm along with splenic lesions and diffuse sclerosis of the L2 vertebral body suggestive of an infiltrative process.  3.  10/12/2021 excisional biopsy of right axillary node revealed classical Hodgkin lymphoma.  4.  11/03/2021 PET/CT revealed multifocal lymphadenopathy above and below the diaphragm consistent with stage III disease.  The L2 lesion was not noted hypermetabolic.  5.  BV + AVD initiated with CR (5 PS = 2) on interim PET/CT.  She did have multifocal pulmonary infiltrates with a negative bronchoscopy and was suspected to have drug-induced lung injury.  Brentuximab vedotin was held after 2 cycles.    Interim History:  I am still hacking, but not as much.  Overall continues to improve.  No interim fevers or infections.  Does note some worsening neuropathy in fingertips and toes.  Some discomfort.  Lymphadenopathy remains clinically resolved and her constitutional symptoms related to her lymphoma are resolved.  Continues to use Bentyl for her intermittent abdominal discomfort.  No interim hospitalizations or transfusions.  Denies fevers, drenching night sweats, or unintentional weight loss.    I have reviewed and updated the past medical, social and family histories in the history section and they are up to date as of this visit.    I have extensively reviewed the laboratory, pathology and radiology, both internal and external, and the key findings are summarized above.         Review of Systems   All other systems reviewed and are negative.        Objective:         ? dexAMETHasone (DECADRON) 4 mg tablet Take one tablet by mouth daily. Take with food.   ? dicyclomine (BENTYL) 10 mg capsule Take one capsule by mouth four times daily.   ? gabapentin (NEURONTIN) 300 mg capsule Take one capsule by mouth at bedtime daily.   ? losartan-hydroCHLOROthiazide (HYZAAR) 50-12.5 mg tablet Take one tablet by mouth every morning.   ? ondansetron HCL (ZOFRAN) 8 mg tablet Take one tablet by mouth every 8 hours as needed for Nausea or Vomiting.   ? pantoprazole DR (PROTONIX) 40 mg tablet Take one tablet by mouth daily.       Vitals:    02/17/22 0851   BP: (!) 142/93   BP Source: Arm, Right Upper   Pulse: 82   Temp: 36.8 ?C (  98.3 ?F)   Resp: 16   SpO2: 100%   TempSrc: Oral   PainSc: Zero   Weight: 63.6 kg (140 lb 3.2 oz)     Body mass index is 22.63 kg/m?Marland Kitchen     Pain Score: Zero       Fatigue Scale: 5    Pain Addressed:  N/A    Patient Evaluated for a Clinical Trial: Patient not eligible for a treatment trial (including not needing treatment, needs palliative care, in remission).     [place] Cooperative Oncology Group performance status is 0, Fully active, able to carry on all pre-disease performance without restriction.Marland Kitchen     Physical Exam  Vitals and nursing note reviewed.   Constitutional:       General: She is not in acute distress.     Appearance: She is well-developed.   HENT:      Head: Normocephalic.   Eyes:      General: No scleral icterus.     Conjunctiva/sclera: Conjunctivae normal.   Cardiovascular:      Rate and Rhythm: Normal rate and regular rhythm.   Pulmonary:      Effort: Pulmonary effort is normal.      Breath sounds: Normal breath sounds.   Abdominal:      Palpations: Abdomen is soft.   Musculoskeletal:      Cervical back: Neck supple.   Lymphadenopathy:      Comments:   Cervical and supraclavicular nodes resolved   Skin:     Findings: No rash.   Neurological:      Mental Status: She is alert.               Assessment and Plan:      Hodgkin lymphoma of lymph nodes of multiple regions [facility])    Impression:  1. Stage III classical Hodgkin lymphoma  2. Hypertension  3. Hyperlipidemia  4. ECOG PS 1    Plan:  Since last visit, her Fungitell came back normal and pulmonary symptoms remain resolved.  We remain with the likely possibility that she has brentuximab vedotin induced lung injury.  As previously mentioned the only way to prove that this is the case would be to rechallenge her.  She does have some treatment emergent peripheral neuropathy and we discussed some exercises that she can do to help ameliorate this.  Additionally I have given her some gabapentin to take in the evenings she does have some mild discomfort at night.  Given the emergence of her peripheral neuropathy it is unlikely that we would be able to deliver any significant quantity of brentuximab vedotin prior to needing to discontinue for neuropathy.  Given this and her pulmonary symptoms, we will continue to hold brentuximab vedotin permanently  Continue AVD with C4 D15 = 02/17/2022.  PEGfilgrastim day 3 = 02/19/2022  Repeat PET/CT at conclusion of therapy for therapy assessment.  RTC with [place] every 2 weeks, or sooner should new or concerning symptoms develop.    I have discussed the diagnosis and treatment plan with the patient and she expresses understanding and wishes to proceed.

## 2022-02-19 ENCOUNTER — Encounter: Admit: 2022-02-19 | Discharge: 2022-02-19 | Payer: BC Managed Care – HMO

## 2022-02-19 DIAGNOSIS — C8178 Other classical Hodgkin lymphoma, lymph nodes of multiple sites: Secondary | ICD-10-CM

## 2022-02-19 MED ORDER — PEGFILGRASTIM-BMEZ 6 MG/0.6 ML SC SYRG
6 mg | Freq: Once | SUBCUTANEOUS | 0 refills | Status: CP
Start: 2022-02-19 — End: ?
  Administered 2022-02-19: 16:00:00 6 mg via SUBCUTANEOUS

## 2022-02-19 NOTE — Progress Notes
Injection given RLQ per plan.   Tolerated well.   Discharged in stable condition.

## 2022-02-19 NOTE — Patient Instructions
[name]  Chemotherapy Instructions    Mary Terrell 02/19/20[birth year]    Chemotherapy Drugs:        Scheduled Medications:      Other:     As Needed Medications:      Other:     Call Immediately to report the following:  Unexplained bleeding or bleeding that will not stop  Difficulty swallowing  Shortness of breath, wheezing, or trouble breathing  Rapid, irregular heartbeat; chest pain  Dizziness, lightheadedness  Rash or cut that swells or turns red, feels hot or painful, or begin to ooze  Diarrhea   Uncontrolled nausea or vomiting  Fever of 100.4 F or higher, or chills    Important Phone Numbers:  [place] Number (answered 24 hours a day) [place] (appointments) [phone]  Social Worker [phone]

## 2022-03-02 NOTE — Progress Notes
Name: Mary Terrell          MRN: [id number]      DOB: March 11, 1956      AGE: 66 y.o.   DATE OF SERVICE: 03/03/2022    Subjective:             Reason for Visit:  Heme/Onc Care      Mary Terrell is a 67 y.o. female.      Cancer Staging   Hodgkin lymphoma of lymph nodes of multiple regions [facility])  Staging form: Hodgkin And Non-Hodgkin Lymphoma, AJCC 8th Edition  - Clinical stage from 11/02/2021: Stage III (Hodgkin lymphoma, A - Asymptomatic) - Signed by [name], MD on 11/25/2021      History of Present Illness    Mary Terrell is a 66 y.o. female patient of Dr.Hoffmann's with cHL seen today for evaluation, labs and cycle 5 day 1 of AVD.  ?  HPI  1. ?Presented December 2022 with close to a year of persistent dry cough and chest x-ray showed a mediastinal mass.  2. ?CT scans of the chest abdomen pelvis revealed multifocal lymphadenopathy above and below the diaphragm along with splenic lesions and diffuse sclerosis of the L2 vertebral body suggestive of an infiltrative process.  3. ?10/12/2021 excisional biopsy of right axillary node revealed classical Hodgkin lymphoma.  4. BV+AVD started 11/11/2021  ?  Subjective  Cough-non productive--improved  Feeling better  Eating/drinking well  Neuropathy in toes--annoying  Fingertip neuropathy--not progressing  Normal stools  Urinating in good amounts  No nausea  Working without trouble  No fevers  No chest pain  No swelling       Review of Systems        Objective:         ? dexAMETHasone (DECADRON) 4 mg tablet Take one tablet by mouth daily. Take with food.   ? dicyclomine (BENTYL) 10 mg capsule Take one capsule by mouth four times daily.   ? gabapentin (NEURONTIN) 300 mg capsule Take one capsule by mouth at bedtime daily.   ? losartan-hydroCHLOROthiazide (HYZAAR) 50-12.5 mg tablet Take one tablet by mouth every morning.   ? ondansetron HCL (ZOFRAN) 8 mg tablet Take one tablet by mouth every 8 hours as needed for Nausea or Vomiting.   ? pantoprazole DR (PROTONIX) 40 mg tablet Take one tablet by mouth daily.     Vitals:    03/03/22 0923   BP: (!) 136/92   BP Source: Arm, Left Upper   Pulse: 78   Temp: 36.9 ?C (98.4 ?F)   Resp: 16   SpO2: 98%   TempSrc: Oral   PainSc: Zero   Weight: 64.5 kg (142 lb 3.2 oz)     Body mass index is 22.95 kg/m?Marland Kitchen     Pain Score: Zero         Pain Addressed:  Prescription provided for pain management and Current regimen working to control pain.  [place] Cooperative Oncology Group performance status is 1, Restricted in physically strenuous activity but ambulatory and able to carry out work of a light or sedentary nature, e.g., light house work, office work.     Physical Exam  Vitals reviewed.   Constitutional:       Appearance: She is well-developed.   HENT:      Head: Normocephalic.   Cardiovascular:      Rate and Rhythm: Normal rate and regular rhythm.   Pulmonary:      Effort: Pulmonary effort is normal.  Breath sounds: Normal breath sounds.   Abdominal:      General: There is no distension.      Palpations: Abdomen is soft. There is no mass.      Tenderness: There is no abdominal tenderness. There is no guarding.   Musculoskeletal:         General: Normal range of motion.      Cervical back: Normal range of motion.   Lymphadenopathy:      Cervical: No cervical adenopathy.      Upper Body:      Right upper body: No supraclavicular or axillary adenopathy.      Left upper body: No supraclavicular or axillary adenopathy.   Skin:     General: Skin is warm and dry.   Neurological:      Mental Status: She is alert and oriented to person, place, and time.     For intact       CBC w/Diff    Lab Results   Component Value Date/Time    WBC 13.1 (H) 03/03/2022 09:15 AM    RBC 4.00 03/03/2022 09:15 AM    HGB 12.1 03/03/2022 09:15 AM    HCT 38.2 03/03/2022 09:15 AM    MCV 95.3 03/03/2022 09:15 AM    MCH 30.3 03/03/2022 09:15 AM    MCHC 31.8 (L) 03/03/2022 09:15 AM    RDW 20.6 (H) 03/03/2022 09:15 AM    PLTCT 232 03/03/2022 09:15 AM    MPV 7.6 03/03/2022 09:15 AM Lab Results   Component Value Date/Time    NEUT 72 03/03/2022 09:15 AM    ANC 9.40 (H) 03/03/2022 09:15 AM    LYMA 12 (L) 03/03/2022 09:15 AM    ALC 1.60 03/03/2022 09:15 AM    MONA 12 03/03/2022 09:15 AM    AMC 1.60 (H) 03/03/2022 09:15 AM    EOSA 3 03/03/2022 09:15 AM    AEC 0.30 03/03/2022 09:15 AM    BASA 1 03/03/2022 09:15 AM    ABC 0.10 03/03/2022 09:15 AM             Assessment and Plan:         1. Advanced stage classical Hodgkin lymphoma, stage III.  She initiated brentuximab with AVD on 2/17.  According to her PET scan on 01/04/2022 she has obtained remission.  Due to pulmonary findings brentuximab was removed from her treatment (last given 12/23/21).  She will proceed with cycle 5-day 1 of AVD with pegfilgrastim support.  She will have a PET scan at the end of treatment and follow-up with Dr. Mikey Bussing.  She will call in the interim with any problems or concerns.  2. She developed lung infiltrates on her CT scan and was evaluated by pulmonary, Dr. Ranae Plumber in April.  She had a positive Fungitell.  She underwent a bronchoscopy.  Infectious work-up was negative and follow up Fungitell is normal.  It was felt to be drug related and brentuximab was removed from her treatment regimen.  Follow-up CT scan showed improvement.  3. Persistent intermittent cough primarily with eating and talking.  Overall it has improved.  4. Abdominal pain, resolved.  5. 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  She uses gabapentin 300 mg at night.  I discussed that she can increase it to 600 mg if needed.  6. She has been taking Protonix daily.    ?  ?

## 2022-03-03 ENCOUNTER — Encounter: Admit: 2022-03-03 | Discharge: 2022-03-03 | Payer: BC Managed Care – HMO

## 2022-03-03 DIAGNOSIS — C8190 Hodgkin lymphoma, unspecified, unspecified site: Secondary | ICD-10-CM

## 2022-03-03 DIAGNOSIS — I10 Essential (primary) hypertension: Secondary | ICD-10-CM

## 2022-03-03 DIAGNOSIS — C8170 Other classical Hodgkin lymphoma, unspecified site: Secondary | ICD-10-CM

## 2022-03-03 DIAGNOSIS — C8178 Other classical Hodgkin lymphoma, lymph nodes of multiple sites: Secondary | ICD-10-CM

## 2022-03-03 DIAGNOSIS — G620 Drug-induced polyneuropathy: Secondary | ICD-10-CM

## 2022-03-03 MED ORDER — PALONOSETRON 0.25 MG/5 ML IV SOLN
.25 mg | Freq: Once | INTRAVENOUS | 0 refills | Status: CP
Start: 2022-03-03 — End: ?
  Administered 2022-03-03: 15:00:00 0.25 mg via INTRAVENOUS

## 2022-03-03 MED ORDER — APREPITANT 7.2 MG/ML IV EMUL
130 mg | Freq: Once | INTRAVENOUS | 0 refills | Status: CP
Start: 2022-03-03 — End: ?
  Administered 2022-03-03: 15:00:00 130 mg via INTRAVENOUS

## 2022-03-03 MED ORDER — VINBLASTINE IVPB
6 mg/m2 | Freq: Once | INTRAVENOUS | 0 refills | Status: CP
Start: 2022-03-03 — End: ?
  Administered 2022-03-03 (×2): 10.44 mg via INTRAVENOUS

## 2022-03-03 MED ORDER — DOXORUBICIN 2 MG/ML IV SOLN
25 mg/m2 | Freq: Once | INTRAVENOUS | 0 refills | Status: CP
Start: 2022-03-03 — End: ?
  Administered 2022-03-03: 16:00:00 43.5 mg via INTRAVENOUS

## 2022-03-03 MED ORDER — DEXAMETHASONE 6 MG PO TAB
12 mg | Freq: Once | ORAL | 0 refills | Status: CP
Start: 2022-03-03 — End: ?
  Administered 2022-03-03: 15:00:00 12 mg via ORAL

## 2022-03-03 MED ORDER — DACARBAZINE IVPB
375 mg/m2 | Freq: Once | INTRAVENOUS | 0 refills | Status: CP
Start: 2022-03-03 — End: ?
  Administered 2022-03-03 (×2): 652.5 mg via INTRAVENOUS

## 2022-03-03 NOTE — Progress Notes
CHEMO NOTE  Verified chemo consent signed and in chart.    Blood return positive via: Port (Single)    BSA and dose double checked (agree with orders as written) with: yes see MAR    Labs/applicable tests checked: CBC and Comprehensive Metabolic Panel (CMP)    Chemo regimen: C5D1 adriamycin, velban, dacarbazine    Rate verified and armband double check with second RN: yes    Patient education offered and stated understanding. Denies questions at this time.      Patient presented to clinic for treatment. Patient's port accessed with positive blood return. Patient's labs okay to treat. Patient tolerated infusion well. Patient's port flushed with saline via turbulent flush and de-accessed per protocol. Patient discharged off unit in stable condition.

## 2022-03-03 NOTE — Patient Instructions
Call Immediately to report the following:  Uncontrolled nausea and/or vomiting, uncontrolled pain, or unusual bleeding.  Temperature of 100.4 F or greater and/or any sign/symptom of infection (redness, warmth, tenderness)  Painful mouth or difficulty swallowing  Red, cracked, or painful hands and/or feet  Diarrhea   Swelling of arms or legs  Rash    Important Phone Numbers:  [facility] Main Number (answered 24 hours a day) [phone]  Cancer Center Scheduling (appointments) [phone] or [phone]  [facility] (for nutritional supplements) [phone]        Port Maintenance - If you have a port, it should be flushed every 6-8 weeks when not in use.  Please check with your MD, nurse, or the scheduler.

## 2022-03-06 ENCOUNTER — Encounter: Admit: 2022-03-06 | Discharge: 2022-03-06 | Payer: BC Managed Care – HMO

## 2022-03-06 DIAGNOSIS — C8178 Other classical Hodgkin lymphoma, lymph nodes of multiple sites: Secondary | ICD-10-CM

## 2022-03-06 DIAGNOSIS — I10 Essential (primary) hypertension: Secondary | ICD-10-CM

## 2022-03-06 DIAGNOSIS — C8190 Hodgkin lymphoma, unspecified, unspecified site: Secondary | ICD-10-CM

## 2022-03-06 MED ORDER — PEGFILGRASTIM-BMEZ 6 MG/0.6 ML SC SYRG
6 mg | Freq: Once | SUBCUTANEOUS | 0 refills | Status: CP
Start: 2022-03-06 — End: ?
  Administered 2022-03-06: 21:00:00 6 mg via SUBCUTANEOUS

## 2022-03-06 NOTE — Patient Instructions
Call Immediately to report the following:  Uncontrolled nausea and/or vomiting, uncontrolled pain, or unusual bleeding.  Temperature of 100.4 F or greater and/or any sign/symptom of infection (redness, warmth, tenderness)  Painful mouth or difficulty swallowing  Red, cracked, or painful hands and/or feet  Diarrhea   Swelling of arms or legs  Rash    Important Phone Numbers:  [facility] Main Number (answered 24 hours a day) [phone]  Cancer Center Scheduling (appointments) [phone] or [phone]  [facility] (for nutritional supplements) [phone]        Port Maintenance - If you have a port, it should be flushed every 6-8 weeks when not in use.  Please check with your MD, nurse, or the scheduler.

## 2022-03-06 NOTE — Progress Notes
Day 3 Cycle 5 Ziextenzo      Injection given per plan.   Tolerated well.   Discharged in stable condition.

## 2022-03-17 ENCOUNTER — Encounter: Admit: 2022-03-17 | Discharge: 2022-03-17 | Payer: BC Managed Care – HMO

## 2022-03-17 DIAGNOSIS — C8178 Other classical Hodgkin lymphoma, lymph nodes of multiple sites: Secondary | ICD-10-CM

## 2022-03-17 DIAGNOSIS — I10 Essential (primary) hypertension: Secondary | ICD-10-CM

## 2022-03-17 DIAGNOSIS — C8190 Hodgkin lymphoma, unspecified, unspecified site: Secondary | ICD-10-CM

## 2022-03-17 MED ORDER — VINBLASTINE IVPB
6 mg/m2 | Freq: Once | INTRAVENOUS | 0 refills | Status: CP
Start: 2022-03-17 — End: ?
  Administered 2022-03-17 (×2): 10.44 mg via INTRAVENOUS

## 2022-03-17 MED ORDER — DACARBAZINE IVPB
375 mg/m2 | Freq: Once | INTRAVENOUS | 0 refills | Status: CP
Start: 2022-03-17 — End: ?
  Administered 2022-03-17 (×2): 652.5 mg via INTRAVENOUS

## 2022-03-17 MED ORDER — APREPITANT 7.2 MG/ML IV EMUL
130 mg | Freq: Once | INTRAVENOUS | 0 refills | Status: CP
Start: 2022-03-17 — End: ?
  Administered 2022-03-17: 15:00:00 130 mg via INTRAVENOUS

## 2022-03-17 MED ORDER — DOXORUBICIN 2 MG/ML IV SOLN
25 mg/m2 | Freq: Once | INTRAVENOUS | 0 refills | Status: CP
Start: 2022-03-17 — End: ?
  Administered 2022-03-17: 16:00:00 43.5 mg via INTRAVENOUS

## 2022-03-17 MED ORDER — DEXAMETHASONE 6 MG PO TAB
12 mg | Freq: Once | ORAL | 0 refills | Status: CP
Start: 2022-03-17 — End: ?
  Administered 2022-03-17: 15:00:00 12 mg via ORAL

## 2022-03-17 MED ORDER — PALONOSETRON 0.25 MG/5 ML IV SOLN
.25 mg | Freq: Once | INTRAVENOUS | 0 refills | Status: CP
Start: 2022-03-17 — End: ?
  Administered 2022-03-17: 15:00:00 0.25 mg via INTRAVENOUS

## 2022-03-17 NOTE — Progress Notes
CHEMO NOTE  Verified chemo consent signed and in chart.    Blood return positive via: Port (Single)    BSA and dose double checked (agree with orders as written) with: yes, see MAR.     Labs/applicable tests checked: CBC and Comprehensive Metabolic Panel (CMP)    Chemo regimen: Day 15, Cycle 5; Adriamycin, Vinblastine, DTIC-DOME    Rate verified and armband double check with second RN: yes    Patient education offered and stated understanding. Denies questions at this time.    Pt arrived for treatment. Port accessed and labs obtained. Pt seen by provider and ok to treat. Pre-medication given then Adriamycin. Pt given rest of infusions and tolerated. Pt denied any questions or concerns. Port flushed and de-accessed. Pt discharged ambulatory.

## 2022-03-17 NOTE — Progress Notes
Name: Mary Terrell          MRN: [id number]      DOB: 1956-05-19      AGE: 66 y.o.   DATE OF SERVICE: 03/17/2022    Subjective:             Reason for Visit:  Follow Up      Mary Terrell is a 66 y.o. female.      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  Presented December 2022 with close to a year of persistent dry cough and chest x-ray showed a mediastinal mass.  2.  CT scans of the chest abdomen pelvis revealed multifocal lymphadenopathy above and below the diaphragm along with splenic lesions and diffuse sclerosis of the L2 vertebral body suggestive of an infiltrative process.  3.  10/12/2021 excisional biopsy of right axillary node revealed classical Hodgkin lymphoma.  4.  11/03/2021 PET/CT revealed multifocal lymphadenopathy above and below the diaphragm consistent with stage III disease.  The L2 lesion was not noted hypermetabolic.  5.  BV + AVD initiated with CR (5 PS = 2) on interim PET/CT.  She did have multifocal pulmonary infiltrates with a negative bronchoscopy and was suspected to have drug-induced lung injury.  Brentuximab vedotin was held after 2 cycles.    Interim History:  I'm feeling much, much better.  Only issue is persistent neuropathy.  Cough, dyspnea and exercise intolerance all resolved.  Not needing anything for her cough anymore.  No interim fevers or infections.  Lymphadenopathy remains clinically resolved.  Continues to use Bentyl for her intermittent abdominal discomfort.  No interim hospitalizations or transfusions.  Denies fevers, drenching night sweats, or unintentional weight loss.    I have reviewed and updated the past medical, social and family histories in the history section and they are up to date as of this visit.    I have extensively reviewed the laboratory, pathology and radiology, both internal and external, and the key findings are summarized above.         Review of Systems   All other systems reviewed and are negative.        Objective:         ? dexAMETHasone (DECADRON) 4 mg tablet Take one tablet by mouth daily. Take with food.   ? dicyclomine (BENTYL) 10 mg capsule Take one capsule by mouth four times daily.   ? gabapentin (NEURONTIN) 300 mg capsule Take one capsule by mouth at bedtime daily.   ? losartan-hydroCHLOROthiazide (HYZAAR) 50-12.5 mg tablet Take one tablet by mouth every morning.   ? ondansetron HCL (ZOFRAN) 8 mg tablet Take one tablet by mouth every 8 hours as needed for Nausea or Vomiting.   ? pantoprazole DR (PROTONIX) 40 mg tablet Take one tablet by mouth daily.       Vitals:    03/17/22 0920   BP: (!) 153/86   BP Source: Arm, Right Upper   Pulse: 74   Temp: Comment: pt eating mint unable to obtain orally   Resp: 16   SpO2: 100%  TempSrc: Oral   PainSc: Zero   Weight: 67.2 kg (148 lb 3.2 oz)     Body mass index is 23.92 kg/m?Marland Kitchen     Pain Score: Zero       Fatigue Scale: 2    Pain Addressed:  N/A    Patient Evaluated for a Clinical Trial: Patient not eligible for a treatment trial (including not needing treatment, needs palliative care, in remission).     [place] Cooperative Oncology Group performance status is 1, Restricted in physically strenuous activity but ambulatory and able to carry out work of a light or sedentary nature, e.g., light house work, office work.     Physical Exam  Vitals and nursing note reviewed.   Constitutional:       General: She is not in acute distress.     Appearance: She is well-developed.   HENT:      Head: Normocephalic.   Eyes:      General: No scleral icterus.     Conjunctiva/sclera: Conjunctivae normal.   Cardiovascular: Rate and Rhythm: Normal rate and regular rhythm.   Pulmonary:      Effort: Pulmonary effort is normal.      Breath sounds: Normal breath sounds.   Abdominal:      Palpations: Abdomen is soft.   Musculoskeletal:      Cervical back: Neck supple.   Lymphadenopathy:      Comments:   Cervical and supraclavicular nodes resolved   Skin:     Findings: No rash.   Neurological:      Mental Status: She is alert.               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)    Impression:  1. Stage III classical Hodgkin lymphoma  2. Grade 1 peripheral neuropathy  3. Hypertension  4. Hyperlipidemia  5. ECOG PS 1    Plan:  Doing extremely well.  Pulmonary symptoms have resolved completely and she is back to her pretreatment baseline in this regard.  I am very pleased with her overall progress.  She does continue to have peripheral neuropathy.  We again discussed that she will need PT and OT at some point and she would like to hold off until the end of treatment which I think is perfectly reasonable.  Fortunately this has not worsened in the interim.    Continue AVD with C5 D15 = 03/17/2022.  PEGfilgrastim day 3 = 03/20/2022  Repeat PET/CT at conclusion of therapy for therapy assessment.  RTC with APP in 2 weeks in 4 weeks, with me in 8 to 10 weeks with repeat PET/CT, or sooner should new or concerning symptoms develop.    I have discussed the diagnosis and treatment plan with the patient and she expresses understanding and wishes to proceed.

## 2022-03-17 NOTE — Progress Notes
PATIENT GIVEN AVS 03/17/22 10:22 AM

## 2022-03-20 ENCOUNTER — Encounter: Admit: 2022-03-20 | Discharge: 2022-03-20 | Payer: BC Managed Care – HMO

## 2022-03-20 DIAGNOSIS — I10 Essential (primary) hypertension: Secondary | ICD-10-CM

## 2022-03-20 DIAGNOSIS — C8178 Other classical Hodgkin lymphoma, lymph nodes of multiple sites: Secondary | ICD-10-CM

## 2022-03-20 DIAGNOSIS — C8190 Hodgkin lymphoma, unspecified, unspecified site: Secondary | ICD-10-CM

## 2022-03-20 MED ORDER — PEGFILGRASTIM-BMEZ 6 MG/0.6 ML SC SYRG
6 mg | Freq: Once | SUBCUTANEOUS | 0 refills | Status: CP
Start: 2022-03-20 — End: ?
  Administered 2022-03-20: 21:00:00 6 mg via SUBCUTANEOUS

## 2022-03-20 NOTE — Patient Instructions
Call Immediately to report the following:  Uncontrolled nausea and/or vomiting, uncontrolled pain, or unusual bleeding.  Temperature of 100.4 F or greater and/or any sign/symptom of infection (redness, warmth, tenderness)  Painful mouth or difficulty swallowing  Red, cracked, or painful hands and/or feet  Diarrhea   Swelling of arms or legs  Rash    Important Phone Numbers:  [facility] Main Number (answered 24 hours a day) [phone]  Cancer Center Scheduling (appointments) [phone] or [phone]  [facility] (for nutritional supplements) [phone]        Port Maintenance - If you have a port, it should be flushed every 6-8 weeks when not in use.  Please check with your MD, nurse, or the scheduler.

## 2022-03-20 NOTE — Progress Notes
Pt came in ambulatory.  D17C5 Ziextenzo injection given in the RLQ subQ tissue.   Pt tolerated well.   Pt discharged ambulatory in good condition.

## 2022-03-30 NOTE — Progress Notes
Name: Mary Terrell          MRN: [id number]      DOB: 11/04/1955      AGE: 65 y.o.   DATE OF SERVICE: 03/31/2022    Subjective:             Reason for Visit:  Heme/Onc Care      Mary Terrell is a 66 y.o. female.      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 female patient of Dr.Hoffmann's with cHL seen today for evaluation, labs and cycle 6 day 1 of AVD.  Brentuximab previously removed from treatment.  ?  HPI  1. ?Presented December 2022 with close to a year of persistent dry cough and chest x-ray showed a mediastinal mass.  2. ?CT scans of the chest abdomen pelvis revealed multifocal lymphadenopathy above and below the diaphragm along with splenic lesions and diffuse sclerosis of the L2 vertebral body suggestive of an infiltrative process.  3. ?10/12/2021 excisional biopsy of right axillary node revealed classical Hodgkin lymphoma.  4. BV+AVD started 11/11/2021  ?  Subjective  Outside working in yard trimming bushes on 7/4  Feeling better  Eating/drinking well  Neuropathy in toes--annoying  Fingertip neuropathy--not progressing  Gabepentin working at [employer] better  Working without trouble  Normal stools  Urinating in good amounts  No nausea  No fevers  No chest pain  No swelling  Cough is pretty much resolved     Review of Systems        Objective:         ? dexAMETHasone (DECADRON) 4 mg tablet Take one tablet by mouth daily. Take with food.   ? dicyclomine (BENTYL) 10 mg capsule Take one capsule by mouth four times daily.   ? gabapentin (NEURONTIN) 300 mg capsule Take one capsule by mouth at bedtime daily.   ? losartan-hydroCHLOROthiazide (HYZAAR) 50-12.5 mg tablet Take one tablet by mouth every morning.   ? ondansetron HCL (ZOFRAN) 8 mg tablet Take one tablet by mouth every 8 hours as needed for Nausea or Vomiting.   ? pantoprazole DR (PROTONIX) 40 mg tablet Take one tablet by mouth daily.     Vitals:    03/31/22 0930   BP: (!) 150/89   BP Source: Arm, Right Upper   Pulse: 81   Temp: 37.1 ?C (98.8 ?F)   Resp: 18   SpO2: 98%   TempSrc: Oral   PainSc: Zero   Weight: 67.3 kg (148 lb 6.4 oz)     Body mass index is 23.95 kg/m?Marland Kitchen     Pain Score: Zero         Pain Addressed:  Prescription provided for pain management and Current regimen working to control pain.  [place] Cooperative Oncology Group performance status is 1, Restricted in physically strenuous activity but ambulatory and able to carry out work of a light or sedentary nature, e.g., light house work, office work.     Physical Exam  Vitals reviewed.   Constitutional:       Appearance: She is well-developed.   HENT:      Head: Normocephalic.   Cardiovascular:  Rate and Rhythm: Normal rate and regular rhythm.   Pulmonary:      Effort: Pulmonary effort is normal.      Breath sounds: Normal breath sounds.   Abdominal:      General: There is no distension.      Palpations: Abdomen is soft. There is no mass.      Tenderness: There is no abdominal tenderness. There is no guarding.   Musculoskeletal:         General: Normal range of motion.      Cervical back: Normal range of motion.   Lymphadenopathy:      Cervical: No cervical adenopathy.      Upper Body:      Right upper body: No supraclavicular or axillary adenopathy.      Left upper body: No supraclavicular or axillary adenopathy.   Skin:     General: Skin is warm and dry.   Neurological:      Mental Status: She is alert and oriented to person, place, and time.     Port intact       CBC w/Diff    Lab Results   Component Value Date/Time    WBC 14.3 (H) 03/31/2022 09:11 AM    RBC 3.80 (L) 03/31/2022 09:11 AM    HGB 11.7 (L) 03/31/2022 09:11 AM    HCT 36.8 03/31/2022 09:11 AM    MCV 96.9 03/31/2022 09:11 AM    MCH 30.9 03/31/2022 09:11 AM    MCHC 31.9 (L) 03/31/2022 09:11 AM    RDW 18.1 (H) 03/31/2022 09:11 AM    PLTCT 224 03/31/2022 09:11 AM    MPV 7.7 03/31/2022 09:11 AM    Lab Results   Component Value Date/Time    NEUT 72 03/03/2022 09:15 AM    ANC 10.44 (H) 03/31/2022 09:11 AM    ANC 9.40 (H) 03/03/2022 09:15 AM    LYMA 12 (L) 03/03/2022 09:15 AM    ALC 1.60 03/03/2022 09:15 AM    MONA 12 03/03/2022 09:15 AM    AMC 1.60 (H) 03/03/2022 09:15 AM    EOSA 3 03/03/2022 09:15 AM    AEC 0.30 03/03/2022 09:15 AM    BASA 1 03/03/2022 09:15 AM    ABC 0.10 03/03/2022 09:15 AM             Assessment and Plan:         1. Advanced stage classical Hodgkin lymphoma, stage III.  She initiated brentuximab with AVD on 11/11/21.  According to her PET scan on 01/04/2022 she has obtained remission.  Due to pulmonary findings brentuximab was removed from her treatment (last given 12/23/21).  She will proceed with cycle 6-day 1 of AVD with pegfilgrastim support.  She will have a PET scan at the end of treatment and follow-up with Dr. Mikey Bussing.  She will call in the interim with any problems or concerns.  2. She developed lung infiltrates on her CT scan and was evaluated by pulmonary, Dr. Ranae Plumber in April.  She had a positive Fungitell.  She underwent a bronchoscopy.  Infectious work-up was negative and follow up Fungitell is normal.  It was felt to be drug related and brentuximab was removed from her treatment regimen.  Follow-up CT scan showed improvement.  3. Abdominal pain, resolved.  4. 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  She uses gabapentin 600 mg at night.   5. She has been taking Protonix daily.

## 2022-03-31 ENCOUNTER — Encounter: Admit: 2022-03-31 | Discharge: 2022-03-31 | Payer: BC Managed Care – HMO

## 2022-03-31 DIAGNOSIS — C8190 Hodgkin lymphoma, unspecified, unspecified site: Secondary | ICD-10-CM

## 2022-03-31 DIAGNOSIS — C8178 Other classical Hodgkin lymphoma, lymph nodes of multiple sites: Secondary | ICD-10-CM

## 2022-03-31 DIAGNOSIS — I10 Essential (primary) hypertension: Secondary | ICD-10-CM

## 2022-03-31 DIAGNOSIS — G620 Drug-induced polyneuropathy: Secondary | ICD-10-CM

## 2022-03-31 MED ORDER — ALTEPLASE 2 MG IK SOLR
2 mg | Freq: Once | 0 refills | Status: CP
Start: 2022-03-31 — End: ?
  Administered 2022-03-31: 14:00:00 2 mg

## 2022-03-31 MED ORDER — DOXORUBICIN 2 MG/ML IV SOLN
25 mg/m2 | Freq: Once | INTRAVENOUS | 0 refills | Status: CP
Start: 2022-03-31 — End: ?
  Administered 2022-03-31: 16:00:00 43.5 mg via INTRAVENOUS

## 2022-03-31 MED ORDER — PALONOSETRON 0.25 MG/5 ML IV SOLN
.25 mg | Freq: Once | INTRAVENOUS | 0 refills | Status: CP
Start: 2022-03-31 — End: ?
  Administered 2022-03-31: 15:00:00 0.25 mg via INTRAVENOUS

## 2022-03-31 MED ORDER — DACARBAZINE IVPB
375 mg/m2 | Freq: Once | INTRAVENOUS | 0 refills | Status: CP
Start: 2022-03-31 — End: ?
  Administered 2022-03-31 (×2): 652.5 mg via INTRAVENOUS

## 2022-03-31 MED ORDER — APREPITANT 7.2 MG/ML IV EMUL
130 mg | Freq: Once | INTRAVENOUS | 0 refills | Status: CP
Start: 2022-03-31 — End: ?
  Administered 2022-03-31: 15:00:00 130 mg via INTRAVENOUS

## 2022-03-31 MED ORDER — GABAPENTIN 300 MG PO CAP
600 mg | ORAL_CAPSULE | Freq: Every evening | ORAL | 1 refills | Status: AC
Start: 2022-03-31 — End: ?

## 2022-03-31 MED ORDER — DEXAMETHASONE 6 MG PO TAB
12 mg | Freq: Once | ORAL | 0 refills | Status: CP
Start: 2022-03-31 — End: ?
  Administered 2022-03-31: 15:00:00 12 mg via ORAL

## 2022-03-31 MED ORDER — VINBLASTINE IVPB
6 mg/m2 | Freq: Once | INTRAVENOUS | 0 refills | Status: CP
Start: 2022-03-31 — End: ?
  Administered 2022-03-31 (×2): 10.44 mg via INTRAVENOUS

## 2022-03-31 NOTE — Progress Notes
Pt in tx for port access/labs and scheduled chemotherapy. Port accessed without difficulties. Labs drawn per order.   Lab results reviewed, pt seen by C. Englert APP today, ok to treat. Pt denies any concerns or questions at this time. Voiced understanding of the treatment regimen.     Premeds given per plan.     CHEMO NOTE  Verified chemo consent signed and in chart.    Verified initiate chemo order in O2    Blood return positive via: port     BSA and dose double checked (agree with orders as written) with: Val RN    Labs/applicable tests checked: cbc, cmp, EF    Chemo regime: Adria, vinblastine, DTIC cycle 6 day 1     Rate verified and armband double checkwith second RN: see EMAR    Patient education offered and stated understanding. Denies questions at this time.      Port flushed per protocol and de-accessed. Pt tolerated the infusion well, left clinic in good condition.

## 2022-04-03 ENCOUNTER — Encounter: Admit: 2022-04-03 | Discharge: 2022-04-03 | Payer: BC Managed Care – HMO

## 2022-04-03 DIAGNOSIS — C8190 Hodgkin lymphoma, unspecified, unspecified site: Secondary | ICD-10-CM

## 2022-04-03 DIAGNOSIS — I10 Essential (primary) hypertension: Secondary | ICD-10-CM

## 2022-04-03 DIAGNOSIS — C8178 Other classical Hodgkin lymphoma, lymph nodes of multiple sites: Secondary | ICD-10-CM

## 2022-04-03 MED ORDER — PEGFILGRASTIM-JMDB 6 MG/0.6 ML SC SYRG
6 mg | Freq: Once | SUBCUTANEOUS | 0 refills | Status: CP
Start: 2022-04-03 — End: ?
  Administered 2022-04-03: 21:00:00 6 mg via SUBCUTANEOUS

## 2022-04-03 NOTE — Patient Instructions
Call Immediately to report the following:  Uncontrolled nausea and/or vomiting, uncontrolled pain, or unusual bleeding.  Temperature of 100.4 F or greater and/or any sign/symptom of infection (redness, warmth, tenderness)  Painful mouth or difficulty swallowing  Red, cracked, or painful hands and/or feet  Diarrhea   Swelling of arms or legs  Rash    Important Phone Numbers:  [facility] Main Number (answered 24 hours a day) [phone]  Cancer Center Scheduling (appointments) [phone]  Cancer [facility] (for nutritional supplements) [phone]

## 2022-04-03 NOTE — Progress Notes
Patient here for injection. Voices no complaints. Fulphila administered as ordered and tolerated well. Discharged home ambulatory.

## 2022-04-13 NOTE — Progress Notes
Name: Mary Terrell          MRN: [id number]      DOB: 12-21-1955      AGE: 66 y.o.   DATE OF SERVICE: 04/14/2022    Subjective:             Reason for Visit:  Follow Up      Mary Terrell is a 66 y.o. female.      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 female patient of Dr.Hoffmann's with cHL seen today for evaluation, labs and cycle 6 day 15 of AVD.  Brentuximab previously removed from treatment.  ?  HPI  1. ?Presented December 2022 with close to a year of persistent dry cough and chest x-ray showed a mediastinal mass.  2. ?CT scans of the chest abdomen pelvis revealed multifocal lymphadenopathy above and below the diaphragm along with splenic lesions and diffuse sclerosis of the L2 vertebral body suggestive of an infiltrative process.  3. ?10/12/2021 excisional biopsy of right axillary node revealed classical Hodgkin lymphoma.  4. BV+AVD started 11/11/2021  5.  Brentuximab removed from treatment after 12/23/2021 dose.  ?  Subjective  Feeling good  Happy to be done after today  Eating/drinking well  Neuropathy in toes--annoying and persistent  Fingertip neuropathy--not progressing  Gabepentin working at night, taking 600 mg  Energy better  Working without trouble  Normal stools  Urinating in good amounts  No nausea  No fevers  No chest pain  No swelling       Review of Systems        Objective:         ? dexAMETHasone (DECADRON) 4 mg tablet Take one tablet by mouth daily. Take with food.   ? dicyclomine (BENTYL) 10 mg capsule Take one capsule by mouth four times daily.   ? gabapentin (NEURONTIN) 300 mg capsule Take two capsules by mouth at bedtime daily.   ? losartan-hydroCHLOROthiazide (HYZAAR) 50-12.5 mg tablet Take one tablet by mouth every morning.   ? ondansetron HCL (ZOFRAN) 8 mg tablet Take one tablet by mouth every 8 hours as needed for Nausea or Vomiting.   ? pantoprazole DR (PROTONIX) 40 mg tablet Take one tablet by mouth daily.     Vitals:    04/14/22 1002   BP: (!) 154/88   BP Source: Arm, Left Upper   Pulse: 80   Temp: 36.9 ?C (98.4 ?F)   Resp: 16   SpO2: 99%   TempSrc: Oral   PainSc: Zero   Weight: 67.5 kg (148 lb 12.8 oz)     Body mass index is 24.02 kg/m?Marland Kitchen     Pain Score: Zero         Pain Addressed:  Prescription provided for pain management and Current regimen working to control pain.  [place] Cooperative Oncology Group performance status is 1, Restricted in physically strenuous activity but ambulatory and able to carry out work of a light or sedentary nature, e.g., light house work, office work.     Physical Exam  Vitals reviewed.   Constitutional:       Appearance: She is well-developed.   HENT:  Head: Normocephalic.   Cardiovascular:      Rate and Rhythm: Normal rate and regular rhythm.   Pulmonary:      Effort: Pulmonary effort is normal.      Breath sounds: Normal breath sounds.   Abdominal:      General: There is no distension.      Palpations: Abdomen is soft. There is no mass.      Tenderness: There is no abdominal tenderness. There is no guarding.   Musculoskeletal:         General: Normal range of motion.      Cervical back: Normal range of motion.   Lymphadenopathy:      Cervical: No cervical adenopathy.      Upper Body:      Right upper body: No supraclavicular or axillary adenopathy.      Left upper body: No supraclavicular or axillary adenopathy.   Skin:     General: Skin is warm and dry.   Neurological:      Mental Status: She is alert and oriented to person, place, and time.     Port intact       CBC w/Diff    Lab Results   Component Value Date/Time    WBC 18.9 (H) 04/14/2022 09:36 AM    RBC 4.08 04/14/2022 09:36 AM    HGB 12.6 04/14/2022 09:36 AM    HCT 39.1 04/14/2022 09:36 AM    MCV 95.8 04/14/2022 09:36 AM    MCH 30.8 04/14/2022 09:36 AM    MCHC 32.1 04/14/2022 09:36 AM    RDW 17.8 (H) 04/14/2022 09:36 AM    PLTCT 216 04/14/2022 09:36 AM    MPV 7.7 04/14/2022 09:36 AM    Lab Results   Component Value Date/Time    NEUT 72 03/03/2022 09:15 AM    ANC 15.13 (H) 04/14/2022 09:36 AM    ANC 9.40 (H) 03/03/2022 09:15 AM    LYMA 12 (L) 03/03/2022 09:15 AM    ALC 1.60 03/03/2022 09:15 AM    MONA 12 03/03/2022 09:15 AM    AMC 1.60 (H) 03/03/2022 09:15 AM    EOSA 3 03/03/2022 09:15 AM    AEC 0.30 03/03/2022 09:15 AM    BASA 1 03/03/2022 09:15 AM    ABC 0.10 03/03/2022 09:15 AM             Assessment and Plan:         1. Advanced stage classical Hodgkin lymphoma, stage III.  She initiated brentuximab with AVD on 11/11/21.  According to her PET scan on 01/04/2022 she has obtained remission.  Due to pulmonary findings brentuximab was removed from her treatment (last given 12/23/21).  She will proceed with cycle 6-day 15 of AVD with pegfilgrastim support.  She will have a PET scan at the end of treatment and follow-up with Dr. Mikey Bussing.  She will call in the interim with any problems or concerns.  2. She developed lung infiltrates on her CT scan and was evaluated by pulmonary, Dr. Ranae Plumber in April.  She had a positive Fungitell.  She underwent a bronchoscopy.  Infectious work-up was negative and follow up Fungitell is normal.  It was felt to be drug related and brentuximab was removed from her treatment regimen.  Follow-up CT scan showed improvement.  3. Abdominal pain, resolved.  4. 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  She uses gabapentin 600 mg at night.   5. She has been taking Protonix daily.  6. We discussed  routine follow-up after scan completed.  She will continue to follow-up with her PCP.

## 2022-04-14 ENCOUNTER — Encounter: Admit: 2022-04-14 | Discharge: 2022-04-14 | Payer: BC Managed Care – HMO

## 2022-04-14 DIAGNOSIS — G620 Drug-induced polyneuropathy: Secondary | ICD-10-CM

## 2022-04-14 DIAGNOSIS — C8178 Other classical Hodgkin lymphoma, lymph nodes of multiple sites: Secondary | ICD-10-CM

## 2022-04-14 DIAGNOSIS — I10 Essential (primary) hypertension: Secondary | ICD-10-CM

## 2022-04-14 DIAGNOSIS — C8190 Hodgkin lymphoma, unspecified, unspecified site: Secondary | ICD-10-CM

## 2022-04-14 MED ORDER — VINBLASTINE IVPB
6 mg/m2 | Freq: Once | INTRAVENOUS | 0 refills | Status: CP
Start: 2022-04-14 — End: ?
  Administered 2022-04-14 (×2): 10.44 mg via INTRAVENOUS

## 2022-04-14 MED ORDER — PALONOSETRON 0.25 MG/5 ML IV SOLN
.25 mg | Freq: Once | INTRAVENOUS | 0 refills | Status: CP
Start: 2022-04-14 — End: ?
  Administered 2022-04-14: 16:00:00 0.25 mg via INTRAVENOUS

## 2022-04-14 MED ORDER — DACARBAZINE IVPB
375 mg/m2 | Freq: Once | INTRAVENOUS | 0 refills | Status: CP
Start: 2022-04-14 — End: ?
  Administered 2022-04-14 (×2): 652.5 mg via INTRAVENOUS

## 2022-04-14 MED ORDER — APREPITANT 7.2 MG/ML IV EMUL
130 mg | Freq: Once | INTRAVENOUS | 0 refills | Status: CP
Start: 2022-04-14 — End: ?
  Administered 2022-04-14: 16:00:00 130 mg via INTRAVENOUS

## 2022-04-14 MED ORDER — DEXAMETHASONE 6 MG PO TAB
12 mg | Freq: Once | ORAL | 0 refills | Status: CP
Start: 2022-04-14 — End: ?
  Administered 2022-04-14: 16:00:00 12 mg via ORAL

## 2022-04-14 MED ORDER — DOXORUBICIN 2 MG/ML IV SOLN
25 mg/m2 | Freq: Once | INTRAVENOUS | 0 refills | Status: CP
Start: 2022-04-14 — End: ?
  Administered 2022-04-14: 16:00:00 43.5 mg via INTRAVENOUS

## 2022-04-14 NOTE — Progress Notes
Day 15 Cycle 6 A + AVD      Port accessed, labs collected.  Patient to follow up with Morey Hummingbird, NP  OK to treat, labs OK to treat.  Tolerated infusion well.   Port turbulently flushed and de-accessed per protocol.  Discharged in stable condition.         CHEMO NOTE  Verified chemo consent signed and in chart.    Blood return positive via: Port (Single)    BSA and dose double checked (agree with orders as written) with: yes     Labs/applicable tests checked: CBC and Comprehensive Metabolic Panel (CMP)    Chemo regimen: Drug/cycle/day C6 D15 A + AVD    Rate verified and armband double check with second RN: yes, see eMar    Patient education offered and stated understanding. Denies questions at this time.

## 2022-04-14 NOTE — Patient Instructions
Call Immediately to report the following:  Uncontrolled nausea and/or vomiting, uncontrolled pain, or unusual bleeding.  Temperature of 100.4 F or greater and/or any sign/symptom of infection (redness, warmth, tenderness)  Painful mouth or difficulty swallowing  Red, cracked, or painful hands and/or feet  Diarrhea   Swelling of arms or legs  Rash    Important Phone Numbers:  [facility] Main Number (answered 24 hours a day) [phone]  Cancer Center Scheduling (appointments) [phone] or [phone]  [facility] (for nutritional supplements) [phone]        Port Maintenance - If you have a port, it should be flushed every 6-8 weeks when not in use.  Please check with your MD, nurse, or the scheduler.

## 2022-04-17 ENCOUNTER — Encounter: Admit: 2022-04-17 | Discharge: 2022-04-17 | Payer: BC Managed Care – HMO

## 2022-04-17 DIAGNOSIS — I10 Essential (primary) hypertension: Secondary | ICD-10-CM

## 2022-04-17 DIAGNOSIS — C8178 Other classical Hodgkin lymphoma, lymph nodes of multiple sites: Secondary | ICD-10-CM

## 2022-04-17 DIAGNOSIS — C8190 Hodgkin lymphoma, unspecified, unspecified site: Secondary | ICD-10-CM

## 2022-04-17 MED ORDER — PEGFILGRASTIM-JMDB 6 MG/0.6 ML SC SYRG
6 mg | Freq: Once | SUBCUTANEOUS | 0 refills | Status: CP
Start: 2022-04-17 — End: ?
  Administered 2022-04-17: 20:00:00 6 mg via SUBCUTANEOUS

## 2022-04-17 NOTE — Progress Notes
Patient presented to clinic for fulphila injection. Patient tolerated injection well in abdominal tissue. Patient discharged off unit in stable condition.

## 2022-05-23 ENCOUNTER — Encounter: Admit: 2022-05-23 | Discharge: 2022-05-23 | Payer: BC Managed Care – HMO

## 2022-05-23 DIAGNOSIS — C8178 Other classical Hodgkin lymphoma, lymph nodes of multiple sites: Secondary | ICD-10-CM

## 2022-05-23 LAB — CBC AND DIFF
ABSOLUTE BASO COUNT: 0 K/UL (ref 0–0.20)
ABSOLUTE EOS COUNT: 0.4 K/UL (ref 0–0.45)
ABSOLUTE LYMPH COUNT: 1.2 K/UL (ref 1.0–4.8)
ABSOLUTE MONO COUNT: 0.6 K/UL (ref 0–0.80)
ABSOLUTE NEUTROPHIL: 3.9 K/UL (ref 1.8–7.0)
BASOPHILS %: 1 % (ref 0–2)
EOSINOPHILS %: 7 % — ABNORMAL HIGH (ref >60)
HEMATOCRIT: 40 % (ref 36–45)
HEMOGLOBIN: 13 g/dL (ref 12.0–15.0)
LYMPHOCYTES %: 19 % — ABNORMAL LOW (ref 24–44)
MCH: 31 pg (ref 26–34)
MCHC: 32 g/dL — ABNORMAL HIGH (ref 32.0–36.0)
MCV: 95 FL (ref 80–100)
MONOCYTES %: 10 % (ref 4–12)
MPV: 8.1 FL (ref 7–11)
NEUTROPHILS %: 63 % (ref 41–77)
PLATELET COUNT: 200 K/UL (ref 150–400)
RBC COUNT: 4.2 M/UL (ref 4.0–5.0)
RDW: 15 % — ABNORMAL HIGH (ref 11–15)
WBC COUNT: 6 K/UL (ref 4.5–11.0)

## 2022-05-23 LAB — POC GLUCOSE: POC GLUCOSE: 80 mg/dL (ref 70–100)

## 2022-05-23 LAB — LDH-LACTATE DEHYDROGENASE: LDH: 250 U/L — ABNORMAL HIGH (ref 100–210)

## 2022-05-23 LAB — COMPREHENSIVE METABOLIC PANEL
POTASSIUM: 4.1 MMOL/L (ref 3.5–5.1)
SODIUM: 141 MMOL/L (ref 137–147)

## 2022-05-23 MED ORDER — RP DX F-18 FDG MCI
10 | Freq: Once | INTRAVENOUS | 0 refills | Status: CP
Start: 2022-05-23 — End: ?
  Administered 2022-05-23: 18:00:00 11.3 via INTRAVENOUS

## 2022-05-26 ENCOUNTER — Encounter: Admit: 2022-05-26 | Discharge: 2022-05-26 | Payer: BC Managed Care – HMO

## 2022-05-26 DIAGNOSIS — C8190 Hodgkin lymphoma, unspecified, unspecified site: Secondary | ICD-10-CM

## 2022-05-26 DIAGNOSIS — C8178 Other classical Hodgkin lymphoma, lymph nodes of multiple sites: Secondary | ICD-10-CM

## 2022-05-26 DIAGNOSIS — I10 Essential (primary) hypertension: Secondary | ICD-10-CM

## 2022-05-26 NOTE — Progress Notes
Name: Mary Terrell          MRN: [id number]      DOB: 1955-11-08      AGE: 66 y.o.   DATE OF SERVICE: 05/26/2022    Subjective:             Reason for Visit:  Heme/Onc Care      Mary Terrell is a 66 y.o. female.      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  Presented December 2022 with close to a year of persistent dry cough and chest x-ray showed a mediastinal mass.  2.  CT scans of the chest abdomen pelvis revealed multifocal lymphadenopathy above and below the diaphragm along with splenic lesions and diffuse sclerosis of the L2 vertebral body suggestive of an infiltrative process.  3.  10/12/2021 excisional biopsy of right axillary node revealed classical Hodgkin lymphoma.  4.  11/03/2021 PET/CT revealed multifocal lymphadenopathy above and below the diaphragm consistent with stage III disease.  The L2 lesion was not noted hypermetabolic.  5.  BV + AVD initiated with CR (5 PS = 2) on interim PET/CT.  She did have multifocal pulmonary infiltrates with a negative bronchoscopy and was suspected to have drug-induced lung injury.  Brentuximab vedotin was held after 2 cycles.  She went on to complete 6 total cycles of therapy with a CR on end of treatment PET scan.    Interim History:  I feel great.  Only issue is persistent neuropathy.  In hands and feet.  She is taking gabapentin 600mg  qHS with some relief, but persistent symptoms.  Not keeping her from sleeping.  Cough, dyspnea and exercise intolerance all remain resolved.  No interim fevers or infections.  Denies new or progressive adenopathy, fevers, drenching night sweats, or unintentional weight loss.  Continues to use Bentyl for her intermittent abdominal discomfort.  No interim hospitalizations or transfusions.    I have reviewed and updated the past medical, social and family histories in the history section and they are up to date as of this visit.    I have extensively reviewed the laboratory, pathology and radiology, both internal and external, and the key findings are summarized above.         Review of Systems   Constitutional: Negative.    HENT: Negative.    Eyes: Negative.    Respiratory: Negative.    Cardiovascular: Negative.    Gastrointestinal: Negative.    Endocrine: Negative.    Genitourinary: Negative.    Musculoskeletal: Negative.    Skin: Negative.    Allergic/Immunologic: Negative.    Neurological: Negative.    Hematological: Negative.    Psychiatric/Behavioral: Negative.    All other systems reviewed and are negative.        Objective:         ? gabapentin (NEURONTIN) 300 mg capsule Take two capsules by mouth at bedtime daily.   ? losartan-hydroCHLOROthiazide (HYZAAR) 50-12.5 mg tablet Take one tablet by mouth every morning.     Vitals:    05/26/22 1042   BP: (!) 147/86  BP Source: Arm, Left Upper   Pulse: 78   Temp: Comment: COULD NOT OBTAIN; ORAL TEMP TOOL NOT WORKING   Resp: 18   SpO2: 99%   PainSc: Zero   Weight: 70 kg (154 lb 6.4 oz)     Body mass index is 24.92 kg/m?Marland Kitchen     Pain Score: Zero       Fatigue Scale: 0-None    Pain Addressed:  N/A    Patient Evaluated for a Clinical Trial: Patient not eligible for a treatment trial (including not needing treatment, needs palliative care, in remission).     [place] Cooperative Oncology Group performance status is 0, Fully active, able to carry on all pre-disease performance without restriction.     Physical Exam  Vitals and nursing note reviewed.   Constitutional:       General: She is not in acute distress.     Appearance: She is well-developed.   HENT:      Head: Normocephalic.   Eyes:      General: No scleral icterus.     Conjunctiva/sclera: Conjunctivae normal.   Cardiovascular:      Rate and Rhythm: Normal rate and regular rhythm.   Pulmonary:      Effort: Pulmonary effort is normal.      Breath sounds: Normal breath sounds.   Abdominal:      Palpations: Abdomen is soft.   Musculoskeletal:      Cervical back: Neck supple.   Lymphadenopathy:      Comments:   Cervical and supraclavicular nodes resolved   Skin:     Findings: No rash.   Neurological:      Mental Status: She is alert.               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 Stage III classical Hodgkin lymphoma  2. Grade 1 peripheral neuropathy  3. Hypertension  4. Hyperlipidemia  5. ECOG PS 0    Plan:  Sharnise is doing fantastically well.  From her spine standpoint, PET/CT reviewed personally (see images above) and shown to patient and her husband today in the exam room.  She has achieved a CR at end of treatment which is an important landmark.  I am very pleased with her overall progress.  We will proceed with active surveillance with symptom directed imaging.  Refer for port removal.  Her pulmonary symptoms remain resolved, consistent with brentuximab vedotin induced lung injury that has fully recovered.    She does continue to have some peripheral neuropathy that has been improving.  She does continue to have some functional motor deficits and these are also getting better.  She does continue to do dedicated exercises at home.  RTC with APP in 4-6 weeks for EOT, with me in 3 months, or sooner should new or concerning symptoms develop.    I have discussed the diagnosis and treatment plan with the patient and she expresses understanding and wishes to proceed.

## 2022-06-02 ENCOUNTER — Encounter: Admit: 2022-06-02 | Discharge: 2022-06-02 | Payer: BC Managed Care – HMO

## 2022-06-02 NOTE — Telephone Encounter
Left voicemail for patient to schedule surgery for port removal with Dr. Joaquim Lai .    Call back number provided.

## 2022-06-07 ENCOUNTER — Encounter: Admit: 2022-06-07 | Discharge: 2022-06-07 | Payer: BC Managed Care – HMO

## 2022-06-07 ENCOUNTER — Ambulatory Visit: Admit: 2022-06-07 | Discharge: 2022-06-07 | Payer: BC Managed Care – HMO

## 2022-06-07 DIAGNOSIS — C8178 Other classical Hodgkin lymphoma, lymph nodes of multiple sites: Secondary | ICD-10-CM

## 2022-06-07 MED ORDER — CEFAZOLIN 1 GRAM IJ SOLR
2 g | Freq: Once | INTRAVENOUS | 0 refills
Start: 2022-06-07 — End: ?

## 2022-06-21 ENCOUNTER — Encounter: Admit: 2022-06-21 | Discharge: 2022-06-21 | Payer: BC Managed Care – HMO

## 2022-06-21 NOTE — Telephone Encounter
Port Removal 07/13/22 at [facility] rescheduled per OR request.   Rescheduled to 08/10/22 at [facility].   Instructions provided, pt verbalizes understanding.

## 2022-07-09 ENCOUNTER — Encounter: Admit: 2022-07-09 | Discharge: 2022-07-09 | Payer: BC Managed Care – HMO

## 2022-07-10 ENCOUNTER — Encounter: Admit: 2022-07-10 | Discharge: 2022-07-10 | Payer: BC Managed Care – HMO

## 2022-07-10 DIAGNOSIS — C8190 Hodgkin lymphoma, unspecified, unspecified site: Secondary | ICD-10-CM

## 2022-07-10 DIAGNOSIS — Z13220 Encounter for screening for lipoid disorders: Secondary | ICD-10-CM

## 2022-07-10 DIAGNOSIS — C8178 Other classical Hodgkin lymphoma, lymph nodes of multiple sites: Secondary | ICD-10-CM

## 2022-07-10 DIAGNOSIS — I10 Essential (primary) hypertension: Secondary | ICD-10-CM

## 2022-07-10 LAB — CBC AND DIFF
ABSOLUTE BASO COUNT: 0 K/UL (ref 0–0.20)
ABSOLUTE EOS COUNT: 0.4 K/UL (ref 0–0.45)
ABSOLUTE LYMPH COUNT: 1.4 K/UL (ref 1.0–4.8)
ABSOLUTE MONO COUNT: 0.5 K/UL (ref 0–0.80)
ABSOLUTE NEUTROPHIL: 4.2 K/UL (ref 1.8–7.0)
BASOPHILS %: 0 % (ref 0–2)
EOSINOPHILS %: 6 % — ABNORMAL HIGH (ref >60)
LYMPHOCYTES %: 21 % — ABNORMAL LOW (ref 24–44)
MCH: 31 pg — ABNORMAL HIGH (ref <100)
MONOCYTES %: 7 % (ref 4–12)
MPV: 7.6 FL (ref 7–11)
NEUTROPHILS %: 66 % (ref 41–77)
PLATELET COUNT: 190 K/UL (ref 150–400)
RBC COUNT: 4.7 M/UL (ref 4.0–5.0)

## 2022-07-10 LAB — COMPREHENSIVE METABOLIC PANEL
CHLORIDE: 103 MMOL/L (ref 98–110)
CREATININE: 0.6 mg/dL (ref <150)
POTASSIUM: 3.9 MMOL/L (ref 3.5–5.1)
SODIUM: 141 MMOL/L (ref 137–147)

## 2022-07-10 LAB — LIPID PROFILE
CHOLESTEROL: 171 mg/dL (ref <200)
HDL: 62 mg/dL (ref >40)
NON HDL CHOLESTEROL: 109 mg/dL (ref 11–15)
VLDL: 14 mg/dL — ABNORMAL HIGH (ref 32.0–36.0)

## 2022-07-17 ENCOUNTER — Encounter: Admit: 2022-07-17 | Discharge: 2022-07-17 | Payer: BC Managed Care – HMO

## 2022-07-17 DIAGNOSIS — I10 Essential (primary) hypertension: Secondary | ICD-10-CM

## 2022-07-17 DIAGNOSIS — C8190 Hodgkin lymphoma, unspecified, unspecified site: Secondary | ICD-10-CM

## 2022-08-09 ENCOUNTER — Encounter: Admit: 2022-08-09 | Discharge: 2022-08-09 | Payer: BC Managed Care – HMO

## 2022-08-09 DIAGNOSIS — C8190 Hodgkin lymphoma, unspecified, unspecified site: Secondary | ICD-10-CM

## 2022-08-09 DIAGNOSIS — I10 Essential (primary) hypertension: Secondary | ICD-10-CM

## 2022-08-10 ENCOUNTER — Encounter: Admit: 2022-08-10 | Discharge: 2022-08-10 | Payer: BC Managed Care – HMO

## 2022-08-10 ENCOUNTER — Ambulatory Visit: Admit: 2022-08-10 | Discharge: 2022-08-10 | Payer: BC Managed Care – HMO

## 2022-08-10 DIAGNOSIS — I10 Essential (primary) hypertension: Secondary | ICD-10-CM

## 2022-08-10 DIAGNOSIS — Z9889 Other specified postprocedural states: Secondary | ICD-10-CM

## 2022-08-10 DIAGNOSIS — C8190 Hodgkin lymphoma, unspecified, unspecified site: Secondary | ICD-10-CM

## 2022-08-10 DIAGNOSIS — Z452 Encounter for adjustment and management of vascular access device: Secondary | ICD-10-CM

## 2022-08-10 DIAGNOSIS — C8198 Hodgkin lymphoma, unspecified, lymph nodes of multiple sites: Secondary | ICD-10-CM

## 2022-08-10 DIAGNOSIS — C8178 Other classical Hodgkin lymphoma, lymph nodes of multiple sites: Secondary | ICD-10-CM

## 2022-08-10 MED ORDER — PROPOFOL INJ 10 MG/ML IV VIAL
INTRAVENOUS | 0 refills | Status: DC
Start: 2022-08-10 — End: 2022-08-10

## 2022-08-10 MED ORDER — MIDAZOLAM 1 MG/ML IJ SOLN
INTRAVENOUS | 0 refills | Status: DC
Start: 2022-08-10 — End: 2022-08-10

## 2022-08-10 MED ORDER — ONDANSETRON HCL (PF) 4 MG/2 ML IJ SOLN
INTRAVENOUS | 0 refills | Status: DC
Start: 2022-08-10 — End: 2022-08-10

## 2022-08-10 MED ORDER — PROPOFOL 10 MG/ML IV EMUL 20 ML (INFUSION)(AM)(OR)
INTRAVENOUS | 0 refills | Status: DC
Start: 2022-08-10 — End: 2022-08-10
  Administered 2022-08-10: 13:00:00 80 ug/kg/min via INTRAVENOUS

## 2022-08-10 MED ORDER — FENTANYL CITRATE (PF) 50 MCG/ML IJ SOLN
INTRAVENOUS | 0 refills | Status: DC
Start: 2022-08-10 — End: 2022-08-10

## 2022-08-10 MED ORDER — LIDOCAINE (PF) 20 MG/ML (2 %) IJ SOLN
INTRAVENOUS | 0 refills | Status: DC
Start: 2022-08-10 — End: 2022-08-10

## 2022-08-10 MED ADMIN — LACTATED RINGERS IV SOLP [4318]: 1000.000 mL | INTRAVENOUS | @ 13:00:00 | Stop: 2022-08-10 | NDC 00338011704

## 2022-08-12 ENCOUNTER — Encounter: Admit: 2022-08-12 | Discharge: 2022-08-12 | Payer: BC Managed Care – HMO

## 2022-08-12 DIAGNOSIS — C8190 Hodgkin lymphoma, unspecified, unspecified site: Secondary | ICD-10-CM

## 2022-08-12 DIAGNOSIS — I10 Essential (primary) hypertension: Secondary | ICD-10-CM

## 2022-09-07 ENCOUNTER — Encounter: Admit: 2022-09-07 | Discharge: 2022-09-07 | Payer: BC Managed Care – HMO

## 2022-09-07 MED ORDER — GABAPENTIN 300 MG PO CAP
ORAL_CAPSULE | 1 refills
Start: 2022-09-07 — End: ?

## 2022-09-22 ENCOUNTER — Encounter: Admit: 2022-09-22 | Discharge: 2022-09-22 | Payer: BC Managed Care – HMO

## 2022-09-22 DIAGNOSIS — C8178 Other classical Hodgkin lymphoma, lymph nodes of multiple sites: Secondary | ICD-10-CM

## 2022-09-22 DIAGNOSIS — C8190 Hodgkin lymphoma, unspecified, unspecified site: Secondary | ICD-10-CM

## 2022-09-22 DIAGNOSIS — I10 Essential (primary) hypertension: Secondary | ICD-10-CM

## 2022-09-22 LAB — COMPREHENSIVE METABOLIC PANEL
ALBUMIN: 4.4 g/dL — ABNORMAL LOW (ref 3.5–5.0)
BLD UREA NITROGEN: 18 mg/dL (ref 7–25)
CALCIUM: 9.4 mg/dL (ref 8.5–10.6)
CHLORIDE: 104 MMOL/L (ref 98–110)
CREATININE: 0.6 mg/dL (ref 0.4–1.00)
GLUCOSE,PANEL: 89 mg/dL (ref 70–100)
POTASSIUM: 3.8 MMOL/L (ref 3.5–5.1)
SODIUM: 142 MMOL/L (ref 137–147)
TOTAL BILIRUBIN: 1 mg/dL (ref 0.3–1.2)
TOTAL PROTEIN: 6.9 g/dL (ref 6.0–8.0)

## 2022-09-22 LAB — CBC AND DIFF
ABSOLUTE BASO COUNT: 0 K/UL (ref 0–0.20)
RBC COUNT: 4.5 M/UL (ref 4.0–5.0)
WBC COUNT: 7.8 K/UL (ref 4.5–11.0)

## 2022-09-22 NOTE — Progress Notes
Name: Mary Terrell          MRN: [id number]      DOB: 10-26-1955      AGE: 66 y.o.   DATE OF SERVICE: 09/22/2022    Subjective:             Reason for Visit:  Heme/Onc Care      Mary Terrell is a 66 y.o. female.      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  Presented December 2022 with close to a year of persistent dry cough and chest x-ray showed a mediastinal mass.  2.  CT scans of the chest abdomen pelvis revealed multifocal lymphadenopathy above and below the diaphragm along with splenic lesions and diffuse sclerosis of the L2 vertebral body suggestive of an infiltrative process.  3.  10/12/2021 excisional biopsy of right axillary node revealed classical Hodgkin lymphoma.  4.  11/03/2021 PET/CT revealed multifocal lymphadenopathy above and below the diaphragm consistent with stage III disease.  The L2 lesion was not noted hypermetabolic.  5.  BV + AVD initiated with CR (5 PS = 2) on interim PET/CT.  She did have multifocal pulmonary infiltrates with a negative bronchoscopy and was suspected to have drug-induced lung injury.  Brentuximab vedotin was held after 2 cycles.  She went on to complete 6 total cycles of therapy with a CR on end of treatment PET scan.    Interim History:  I'm good.  Still with peripheral neuropathy.  It is stably improving and hand strength is coming back.  Taking gabapentin every night and able to sleep.  Cough, dyspnea and exercise intolerance all remain resolved.  No interim fevers or infections.  Denies new or progressive adenopathy, fevers, drenching night sweats, or unintentional weight loss.  No interim hospitalizations or transfusions.    I have reviewed and updated the past medical, social and family histories in the history section and they are up to date as of this visit.    I have extensively reviewed the laboratory, pathology and radiology, both internal and external, and the key findings are summarized above.           Review of Systems   Constitutional: Negative.    HENT: Negative.     Eyes: Negative.    Respiratory: Negative.     Cardiovascular: Negative.    Gastrointestinal: Negative.    Endocrine: Negative.    Genitourinary: Negative.    Musculoskeletal: Negative.    Skin: Negative.    Allergic/Immunologic: Negative.    Neurological: Negative.    Hematological: Negative.    Psychiatric/Behavioral: Negative.           Objective:          acetaminophen (ACETAMINOPHEN EXTRA STRENGTH) 500 mg tablet Take one tablet by mouth every 6 hours as needed. Max of 4,000 mg of acetaminophen in 24 hours.    gabapentin (NEURONTIN) 300 mg capsule TAKE TWO CAPSULES BY MOUTH EVERY NIGHT AT BEDTIME    ibuprofen (MOTRIN) 600 mg tablet Take one tablet by mouth three times daily. Take with food.  losartan-hydroCHLOROthiazide (HYZAAR) 50-12.5 mg tablet Take one tablet by mouth every morning.       Vitals:    09/22/22 1449   BP: (!) 158/93   BP Source: Arm, Right Upper   Pulse: 75   Temp: 36.4 ?C (97.5 ?F)   Resp: 16   SpO2: 97%   TempSrc: Temporal   PainSc: Zero   Weight: 77.7 kg (171 lb 3.2 oz)     Body mass index is 27.63 kg/m?Marland Kitchen     Pain Score: Zero            Pain Addressed:  N/A    Patient Evaluated for a Clinical Trial: Patient not eligible for a treatment trial (including not needing treatment, needs palliative care, in remission).     [place] Cooperative Oncology Group performance status is 0, Fully active, able to carry on all pre-disease performance without restriction.Marland Kitchen     Physical Exam  Vitals and nursing note reviewed.   Constitutional:       General: She is not in acute distress.     Appearance: She is well-developed.   HENT:      Head: Normocephalic.   Eyes:      General: No scleral icterus.     Conjunctiva/sclera: Conjunctivae normal.   Cardiovascular:      Rate and Rhythm: Normal rate and regular rhythm.   Pulmonary:      Effort: Pulmonary effort is normal.      Breath sounds: Normal breath sounds.   Abdominal:      Palpations: Abdomen is soft.   Musculoskeletal:      Cervical back: Neck supple.   Lymphadenopathy:      Comments:   Cervical and supraclavicular nodes resolved   Skin:     Findings: No rash.   Neurological:      Mental Status: She is alert.               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  She has no signs or symptoms today that are worrisome for relapsed or recurrent disease.  She continues to have peripheral neuropathy that is somewhat bothersome, but continues to slowly improve.  She will continue gabapentin nightly.    Her pulmonary symptoms remain resolved, consistent with brentuximab vedotin induced lung injury that has fully recovered.    We will continue active surveillance with symptom directed imaging.  I do recommend an RSV vaccine, COVID booster, and that she initiate Shingrix vaccinations and high-dose pneumococcal vaccinations.  She will pursue these locally.  RTC with APP in 3 months, with me in 6 months, or sooner should new or concerning symptoms develop.    I have discussed the diagnosis and treatment plan with the patient and she expresses understanding and wishes to proceed.

## 2022-09-26 ENCOUNTER — Encounter: Admit: 2022-09-26 | Discharge: 2022-09-26 | Payer: BC Managed Care – HMO

## 2022-12-04 ENCOUNTER — Encounter: Admit: 2022-12-04 | Discharge: 2022-12-04 | Payer: BC Managed Care – HMO

## 2022-12-07 ENCOUNTER — Encounter: Admit: 2022-12-07 | Discharge: 2022-12-07 | Payer: BC Managed Care – HMO

## 2022-12-25 ENCOUNTER — Encounter: Admit: 2022-12-25 | Discharge: 2022-12-25 | Payer: BC Managed Care – HMO

## 2022-12-25 DIAGNOSIS — I10 Essential (primary) hypertension: Secondary | ICD-10-CM

## 2022-12-25 DIAGNOSIS — C8190 Hodgkin lymphoma, unspecified, unspecified site: Secondary | ICD-10-CM

## 2022-12-25 DIAGNOSIS — C8178 Other classical Hodgkin lymphoma, lymph nodes of multiple sites: Secondary | ICD-10-CM

## 2022-12-25 LAB — CBC AND DIFF
ABSOLUTE BASO COUNT: 0.1 K/UL (ref 0–0.20)
ABSOLUTE EOS COUNT: 0.4 K/UL (ref 0–0.45)
ABSOLUTE LYMPH COUNT: 2.2 K/UL (ref 1.0–4.8)
ABSOLUTE MONO COUNT: 0.5 K/UL (ref 0–0.80)
ABSOLUTE NEUTROPHIL: 4.9 K/UL (ref 1.8–7.0)
BASOPHILS %: 1 % (ref 0–2)
EOSINOPHILS %: 5 % (ref 0–5)
HEMATOCRIT: 44 % (ref 36–45)
HEMOGLOBIN: 14 g/dL (ref 12.0–15.0)
LYMPHOCYTES %: 28 % (ref 24–44)
MCH: 32 pg (ref 26–34)
MCHC: 33 g/dL (ref 32.0–36.0)
MCV: 95 FL (ref 80–100)
MONOCYTES %: 7 % (ref 4–12)
MPV: 7.4 FL (ref 7–11)
NEUTROPHILS %: 59 % (ref 41–77)
PLATELET COUNT: 228 K/UL (ref 150–400)
RBC COUNT: 4.6 M/UL (ref 4.0–5.0)
RDW: 13 % (ref 11–15)
WBC COUNT: 8.1 K/UL (ref 4.5–11.0)

## 2022-12-27 ENCOUNTER — Encounter: Admit: 2022-12-27 | Discharge: 2022-12-27 | Payer: BC Managed Care – HMO

## 2022-12-27 DIAGNOSIS — I10 Essential (primary) hypertension: Secondary | ICD-10-CM

## 2022-12-27 DIAGNOSIS — C8190 Hodgkin lymphoma, unspecified, unspecified site: Secondary | ICD-10-CM

## 2023-02-01 ENCOUNTER — Encounter: Admit: 2023-02-01 | Discharge: 2023-02-01 | Payer: BC Managed Care – HMO

## 2023-05-23 ENCOUNTER — Encounter: Admit: 2023-05-23 | Discharge: 2023-05-23 | Payer: MEDICARE

## 2023-05-23 DIAGNOSIS — C8190 Hodgkin lymphoma, unspecified, unspecified site: Secondary | ICD-10-CM

## 2023-05-23 DIAGNOSIS — I10 Essential (primary) hypertension: Secondary | ICD-10-CM

## 2023-05-23 DIAGNOSIS — R29898 Other symptoms and signs involving the musculoskeletal system: Secondary | ICD-10-CM

## 2023-05-23 DIAGNOSIS — C8178 Other classical Hodgkin lymphoma, lymph nodes of multiple sites: Secondary | ICD-10-CM

## 2023-05-23 LAB — CBC AND DIFF
ABSOLUTE BASO COUNT: 0 10*3/uL (ref 0–0.20)
ABSOLUTE EOS COUNT: 0.3 10*3/uL (ref 0–0.45)
ABSOLUTE LYMPH COUNT: 1.9 10*3/uL (ref 1.0–4.8)
ABSOLUTE MONO COUNT: 0.6 10*3/uL (ref 0–0.80)
ABSOLUTE NEUTROPHIL: 5.6 10*3/uL (ref 1.8–7.0)
BASOPHILS %: 1 % (ref 0–2)
EOSINOPHILS %: 3 % (ref 0–5)
HEMATOCRIT: 45 % — ABNORMAL HIGH (ref 36–45)
HEMOGLOBIN: 15 g/dL — ABNORMAL HIGH (ref 12.0–15.0)
LYMPHOCYTES %: 23 % — ABNORMAL LOW (ref >60)
MCH: 32 pg (ref 26–34)
MCHC: 33 g/dL (ref 32.0–36.0)
MCV: 96 FL — ABNORMAL HIGH (ref 80–100)
MONOCYTES %: 7 % (ref 4–12)
MPV: 7.3 FL (ref 7–11)
NEUTROPHILS %: 66 % (ref 41–77)
PLATELET COUNT: 222 10*3/uL (ref 150–400)
RBC COUNT: 4.6 M/UL (ref 4.0–5.0)
RDW: 13 % (ref 11–15)
WBC COUNT: 8.5 10*3/uL (ref 4.5–11.0)

## 2023-05-23 LAB — COMPREHENSIVE METABOLIC PANEL
CHLORIDE: 104 MMOL/L (ref 98–110)
GLUCOSE,PANEL: 91 mg/dL (ref 70–100)
POTASSIUM: 3.7 MMOL/L (ref 3.5–5.1)
SODIUM: 141 MMOL/L (ref 137–147)

## 2023-05-23 NOTE — Progress Notes
Name: Mary Terrell          MRN: [id number]      DOB: Jan 16, 1956      AGE: 67 y.o.   DATE OF SERVICE: 05/23/2023    Subjective:             Reason for Visit:  Follow Up      Mary Terrell is a 67 y.o. female.        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  Presented December 2022 with close to a year of persistent dry cough and chest x-ray showed a mediastinal mass.  2.  CT scans of the chest abdomen pelvis revealed multifocal lymphadenopathy above and below the diaphragm along with splenic lesions and diffuse sclerosis of the L2 vertebral body suggestive of an infiltrative process.  3.  10/12/2021 excisional biopsy of right axillary node revealed classical Hodgkin lymphoma.  4.  11/03/2021 PET/CT revealed multifocal lymphadenopathy above and below the diaphragm consistent with stage III disease.  The L2 lesion was not noted hypermetabolic.  5.  BV + AVD initiated with CR (5 PS = 2) on interim PET/CT.  She did have multifocal pulmonary infiltrates with a negative bronchoscopy and was suspected to have drug-induced lung injury.  Brentuximab vedotin was held after 2 cycles.  She went on to complete 6 total cycles of therapy with a CR on end of treatment PET scan.    Interim History:  Good.  Still with some peripheral neuropathy and it's better, but still with me.  Stength is much improved and she is a lot more functional but still symptomatic.  Cough, dyspnea and exercise intolerance all remain resolved.  Denies new or progressive adenopathy, fevers, drenching night sweats, or unintentional weight loss.  No interim fevers or infections.  No interim hospitalizations or transfusions.    I have reviewed and updated the past medical, social and family histories in the history section and they are up to date as of this visit.    I have extensively reviewed the laboratory, pathology and radiology, both internal and external, and the key findings are summarized above.         Review of Systems   All other systems reviewed and are negative.        Objective:          losartan-hydroCHLOROthiazide (HYZAAR) 50-12.5 mg tablet Take one tablet by mouth every morning.       Vitals:    05/23/23 1048   BP: (!) 139/99   BP Source: Arm, Left Upper   Pulse: 75   Temp: 36.9 ?C (98.4 ?F)   Resp: 20   SpO2: 100%   TempSrc: Temporal   PainSc: Zero   Weight: 82.3 kg (181 lb 6.4 oz)   Height: 167.6 cm (5' 6)     Body mass index is 29.28 kg/m?Marland Kitchen     Pain Score: Zero            Pain Addressed:  N/A    Patient Evaluated for a Clinical Trial: Patient not eligible for a  treatment trial (including not needing treatment, needs palliative care, in remission).     [place] Cooperative Oncology Group performance status is 0, Fully active, able to carry on all pre-disease performance without restriction.       Physical Exam  Vitals and nursing note reviewed.   Constitutional:       General: She is not in acute distress.     Appearance: She is well-developed.   HENT:      Head: Normocephalic.   Eyes:      General: No scleral icterus.     Conjunctiva/sclera: Conjunctivae normal.   Cardiovascular:      Rate and Rhythm: Normal rate and regular rhythm.   Pulmonary:      Effort: Pulmonary effort is normal.      Breath sounds: Normal breath sounds.   Abdominal:      Palpations: Abdomen is soft.   Musculoskeletal:      Cervical back: Neck supple.   Lymphadenopathy:      Comments:   Cervical and supraclavicular nodes resolved   Skin:     Findings: No rash.   Neurological:      Mental Status: She is alert.               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  She has no signs or symptoms today that are worrisome for relapsed or recurrent disease.  Her peripheral neuropathy continues to improve but remains a bit bothersome.  She also notes some strength deficits that have persisted since chemotherapy.  Refer to oncology rehab services today.  [place] closest.  We will continue active surveillance with symptom directed imaging.  I do recommend an RSV vaccine, COVID booster, and that she initiate Shingrix vaccinations and high-dose pneumococcal vaccinations.  She will pursue these locally.  RTC with APP in 3 months, with me in 6 months, or sooner should new or concerning symptoms develop.    I have discussed the diagnosis and treatment plan with the patient and she expresses understanding and wishes to proceed.

## 2023-08-02 IMAGING — US BXLYN
1 series · 14 of 15 positions shown · non-contrast
Comparison: none

[Series 1: us biopsy lymph node · 14 of 15 slices shown]
[im 1/15]
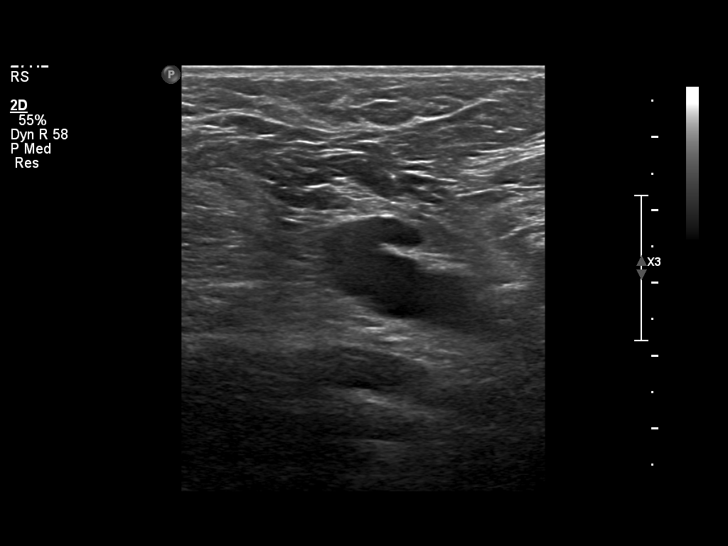
[im 2/15]
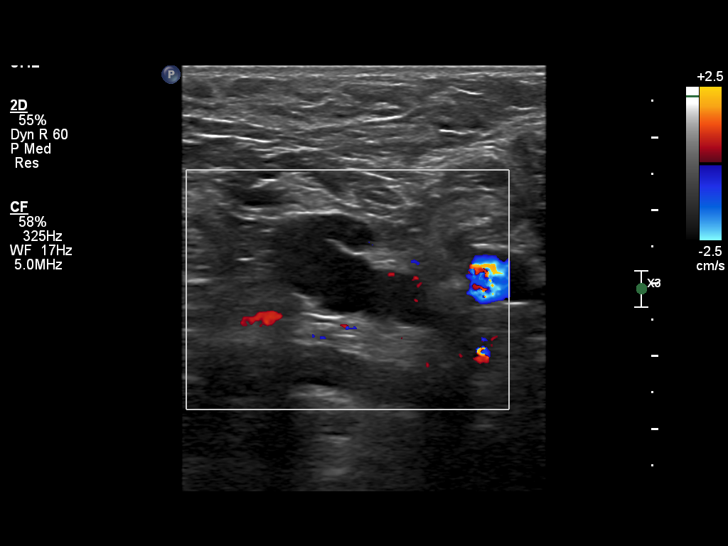
[im 3/15]
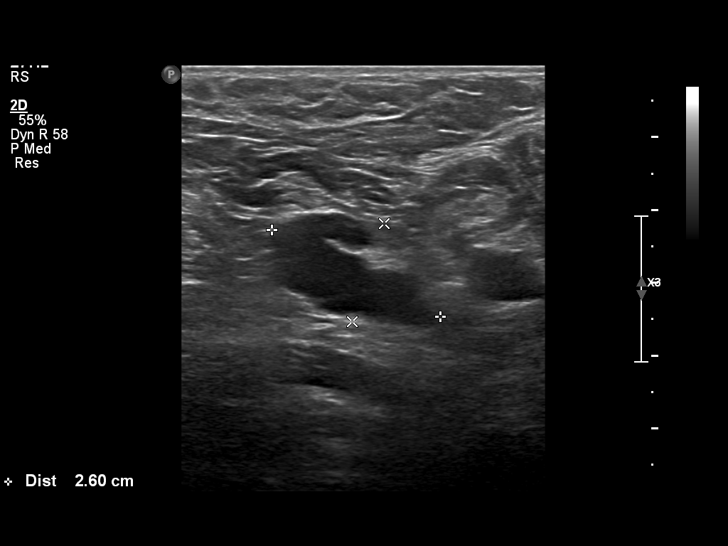
[im 4/15]
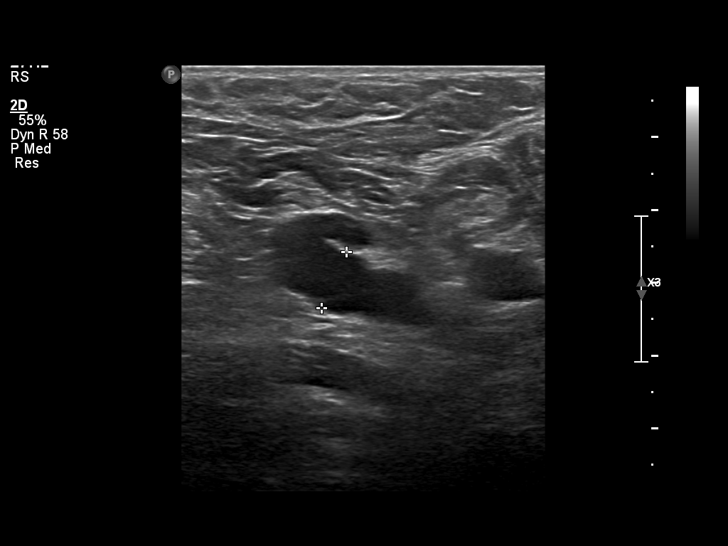
[im 5/15]
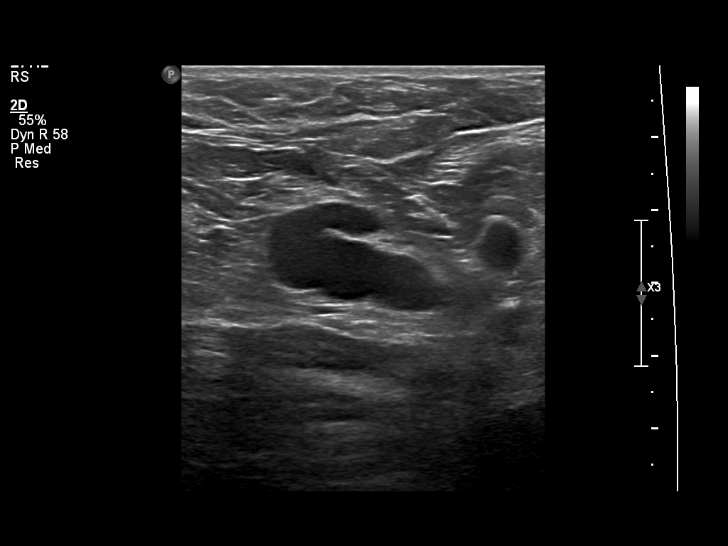
[im 6/15]
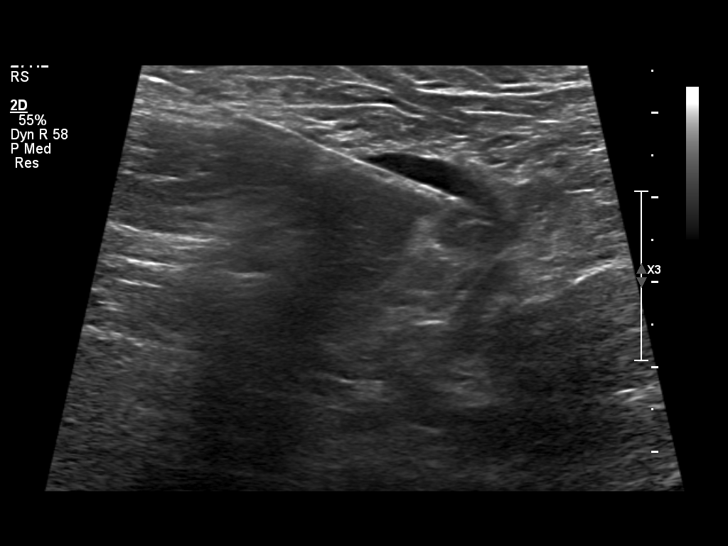
[im 7/15]
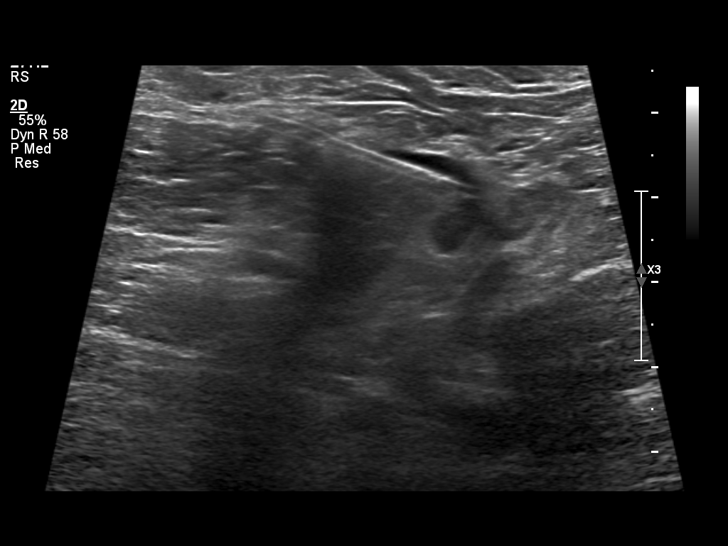
[im 9/15]
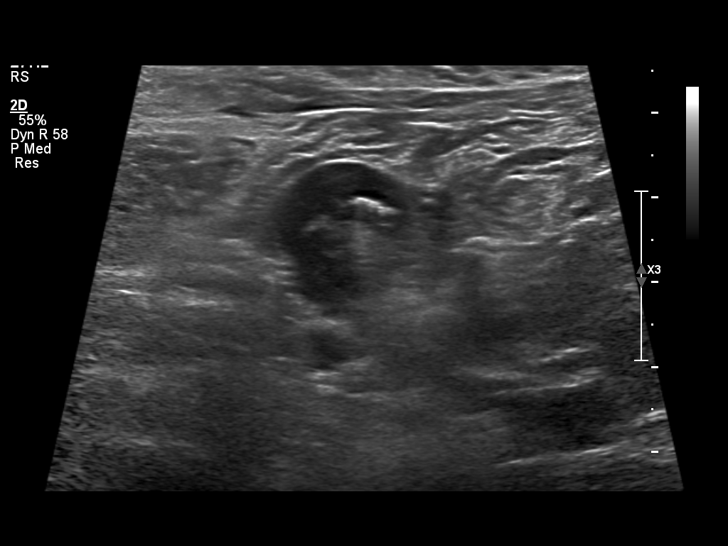
[im 10/15]
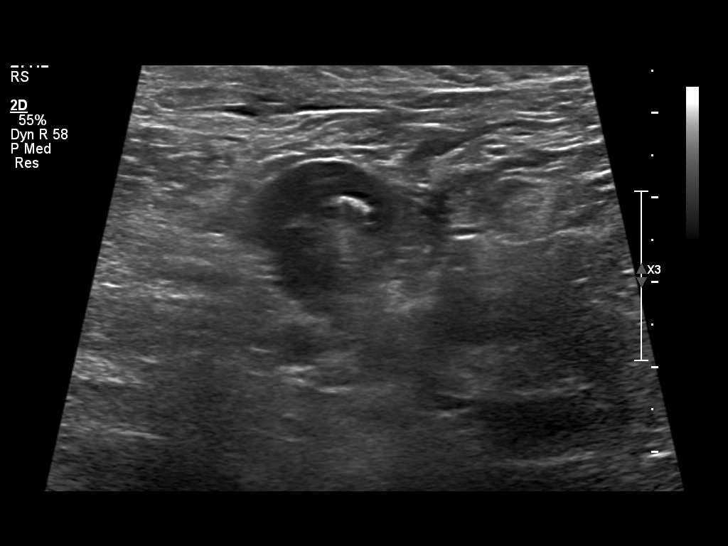
[im 11/15]
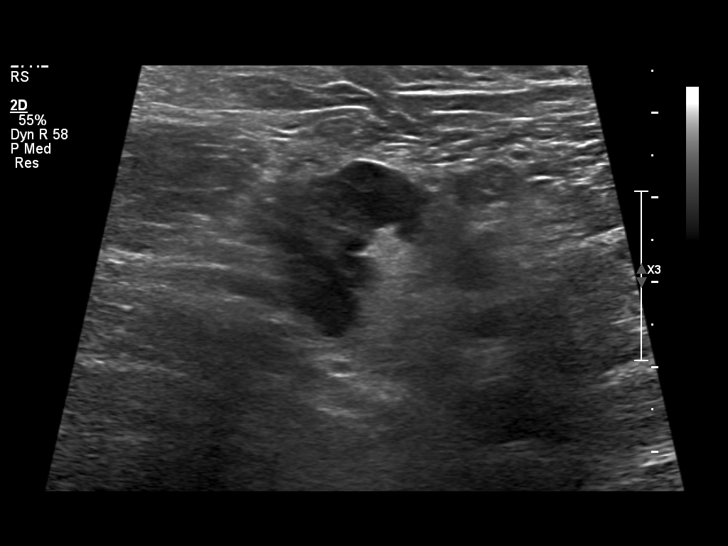
[im 12/15]
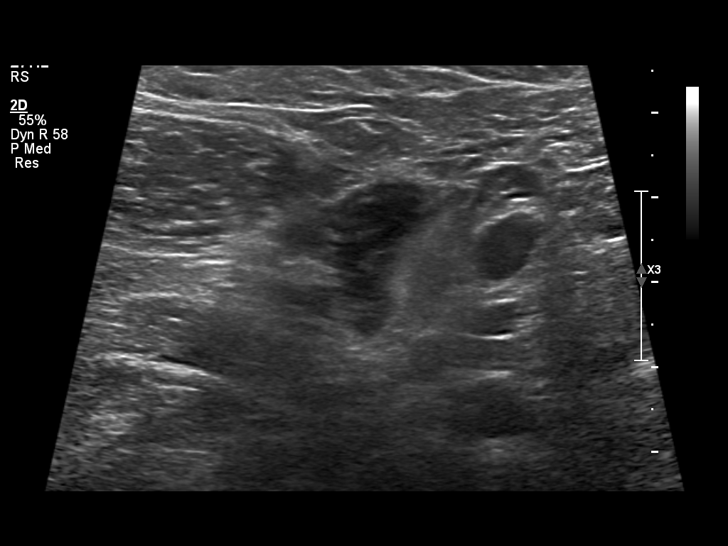
[im 13/15]
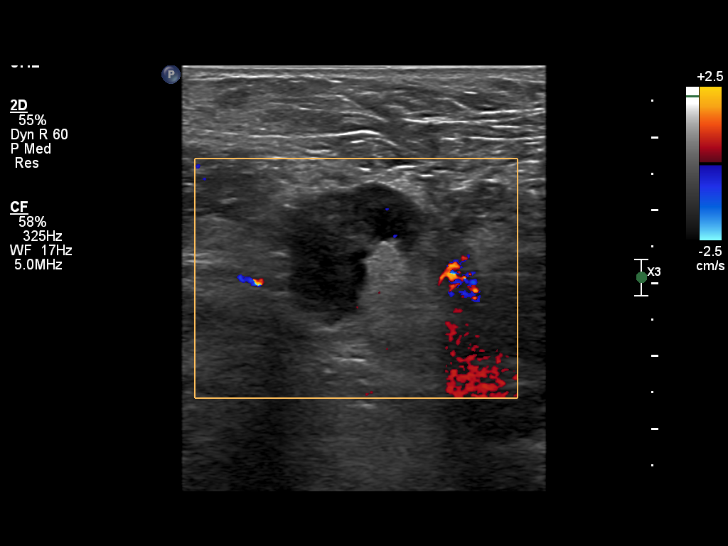
[im 14/15]
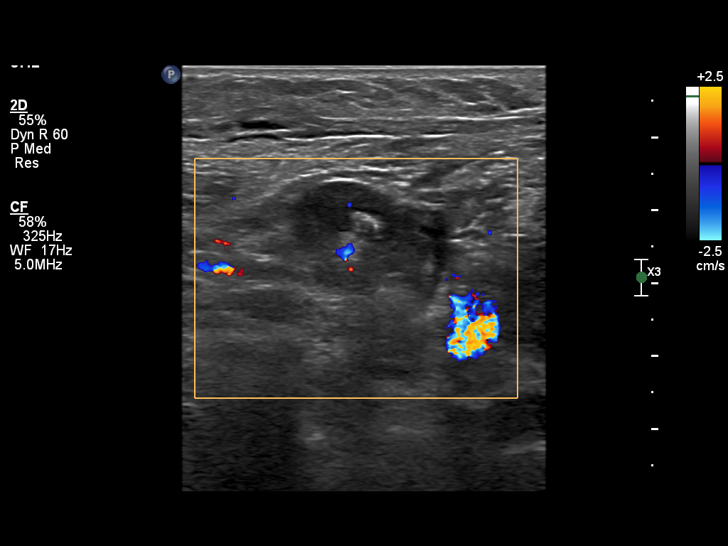
[im 15/15]
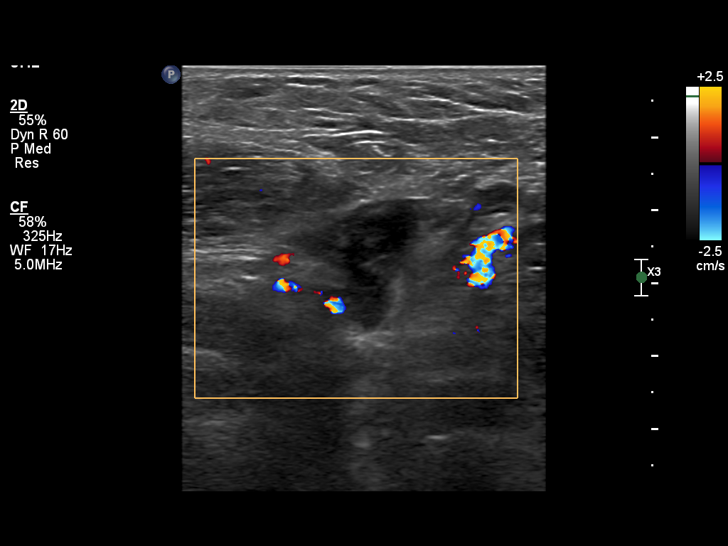

[14 of 15 positions shown; findings below may reference images not displayed]

DIAGNOSTIC STUDIES

EXAM

ULTRASONIC GUIDANCE FOR NEEDLE PLACEMENT, IMAGING SUPERVISION AND INTERPRETATION, CPT 91785

INDICATION

abnormal lymph node right axillary
JL/CF

COMPARISONS

CT dated 09/15/2021.

TECHNIQUE AND PROCEDURE

The procedure, its risks and benefits were discussed with the patient. Immediately afterwards, the
patient was placed in the ultrasound stretcher in the supine position and following the selection of
an adequate approach the skin was marked and prepped and draped in the usual sterile fashion.
Subsequently, the soft tissues were infiltrated with 1% Xylocaine and immediately afterwards, a
small incision was made in the skin with a scalpel in the right axilla overlying the enlarged lymph
node. Utilizing a 17 gauge gauge guide and a 18 gauge gauge biopsy gun multiple samples of the
enlarged lymph node were obtained and submitted for permanent section.

Post biopsy clip was deployed and post procedural mammogram was obtained.

IMPRESSION

Ultrasound guided right axillary lymph node biopsy as described above.

Tech Notes:

JL/CF

## 2023-08-09 ENCOUNTER — Encounter: Admit: 2023-08-09 | Discharge: 2023-08-09 | Payer: MEDICARE

## 2023-08-13 NOTE — Progress Notes
Name: Mary Terrell          MRN: [id number]      DOB: [birth year]      AGE: 67 y.o.   DATE OF SERVICE: 08/15/2023    Subjective:             Reason for Visit:  Follow Up      Mary Terrell is a 67 y.o. female.      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 female patient of Dr.Hoffmann's with cHL seen today for evaluation and labs.  She completed 6 cycles of BV+AVD with CR according to her end of treatment PET scan.      Brentuximab was removed from regimen due to pulmonary findings during treatment.  Her last treatment with brentuximab was 12/23/2021 (cycle 2-day 15).     HPI  1.  Presented December 2022 with close to a year of persistent dry cough and chest x-ray showed a mediastinal mass.  2.  CT scans of the chest abdomen pelvis revealed multifocal lymphadenopathy above and below the diaphragm along with splenic lesions and diffuse sclerosis of the L2 vertebral body suggestive of an infiltrative process.  3.  10/12/2021 excisional biopsy of right axillary node revealed classical Hodgkin lymphoma.  4. BV+AVD started 11/11/2021  5.  Brentuximab removed from treatment after 12/23/2021 dose.  6.  BV + AVD initiated with CR (5 PS = 2) on interim PET/CT.  She did have multifocal pulmonary infiltrates with a negative bronchoscopy and was suspected to have drug-induced lung injury.  Brentuximab vedotin was held after 2 cycles.  She went on to complete 6 total cycles of therapy with a CR on end of treatment PET scan.      Subjective  Doing well  Hair has grown back  Active but has gained weight  She has been walking with her dogs  No fevers or sickness  Her husband had hernia surgery and is off until after the new year  Mood is good  Eating without trouble  Up-to-date with PCP  No swelling or skin changes  No chest pain or breathing changes  Walking with dogs    She is with husband.      Review of Systems      Objective:          losartan-hydroCHLOROthiazide (HYZAAR) 50-12.5 mg tablet Take one tablet by mouth every morning.     Vitals:    08/15/23 1456 08/15/23 1457   BP: 139/87    BP Source: Arm, Right Upper Arm, Right Upper   Pulse: 90    Temp: 37.2 ?C (99 ?F)    Resp: 16    SpO2: 100%    O2 Device:  None (Room air)   TempSrc: Oral Oral   PainSc: Zero Zero   Weight: 82.6 kg (182 lb) 82.6 kg (182 lb)         Body mass index is 29.38 kg/m?Marland Kitchen     Pain Score: Zero     Pain Addressed:  Prescription provided for pain management and Current regimen working to control pain.  [place] Cooperative Oncology Group performance status is 1, Restricted in physically strenuous activity but  ambulatory and able to carry out work of a light or sedentary nature, e.g., light house work, office work.     Physical Exam  Vitals reviewed.   Constitutional:       Appearance: She is well-developed.   HENT:      Head: Normocephalic.   Cardiovascular:      Rate and Rhythm: Normal rate and regular rhythm.   Pulmonary:      Effort: Pulmonary effort is normal.      Breath sounds: Normal breath sounds.   Abdominal:      General: There is no distension.      Palpations: Abdomen is soft. There is no mass.      Tenderness: There is no abdominal tenderness. There is no guarding.   Musculoskeletal:         General: Normal range of motion.      Cervical back: Normal range of motion.   Lymphadenopathy:      Cervical: No cervical adenopathy.      Upper Body:      Right upper body: No supraclavicular or axillary adenopathy.      Left upper body: No supraclavicular or axillary adenopathy.   Skin:     General: Skin is warm and dry.   Neurological:      Mental Status: She is alert and oriented to person, place, and time.            CBC w/Diff    Lab Results   Component Value Date/Time    WBC 8.60 08/15/2023 02:45 PM    RBC 4.60 08/15/2023 02:45 PM    HGB 15.4 (H) 08/15/2023 02:45 PM    HCT 44.5 08/15/2023 02:45 PM    MCV 96.8 08/15/2023 02:45 PM    MCH 33.4 08/15/2023 02:45 PM    MCHC 34.6 08/15/2023 02:45 PM    RDW 13.1 08/15/2023 02:45 PM    PLTCT 246 08/15/2023 02:45 PM    MPV 7.9 08/15/2023 02:45 PM    Lab Results   Component Value Date/Time    NEUT 69 08/15/2023 02:45 PM    ANC 5.90 08/15/2023 02:45 PM    LYMA 23 (L) 08/15/2023 02:45 PM    ALC 1.90 08/15/2023 02:45 PM    MONA 5 08/15/2023 02:45 PM    AMC 0.50 08/15/2023 02:45 PM    EOSA 3 08/15/2023 02:45 PM    AEC 0.20 08/15/2023 02:45 PM    BASA 0 08/15/2023 02:45 PM    ABC 0.00 08/15/2023 02:45 PM             Assessment and Plan:         Advanced stage classical Hodgkin lymphoma, stage III.  She initiated brentuximab with AVD on 11/11/21.  She completed 6 cycles of treatment.  Due to pulmonary findings brentuximab was removed from her treatment (last given 12/23/21-cycle 2-day 15).  According to her end of treatment scan she obtained a complete remission.  She is doing very well and has not signs or symptoms of disease. Only residual side effect is very mild neuropathy. She will follow-up with Dr. Mikey Bussing as scheduled in February 2025.  Peripheral neuropathy, grade 1.  It does not interfere with function.  Influenza done.  Plans to get RSV and shingles vaccine soon locally.    She will continue to follow-up with her PCP.  She is on Hyzaar for blood pressure.  We discussed routine exercise.

## 2023-08-14 ENCOUNTER — Encounter: Admit: 2023-08-14 | Discharge: 2023-08-14 | Payer: MEDICARE

## 2023-08-15 ENCOUNTER — Encounter: Admit: 2023-08-15 | Discharge: 2023-08-15 | Payer: MEDICARE

## 2023-08-15 DIAGNOSIS — G620 Drug-induced polyneuropathy: Secondary | ICD-10-CM

## 2023-08-15 DIAGNOSIS — C8178 Other classical Hodgkin lymphoma, lymph nodes of multiple sites: Secondary | ICD-10-CM

## 2023-11-01 ENCOUNTER — Encounter: Admit: 2023-11-01 | Discharge: 2023-11-01 | Payer: MEDICARE

## 2023-11-14 ENCOUNTER — Encounter: Admit: 2023-11-14 | Discharge: 2023-11-14 | Payer: MEDICARE

## 2023-11-21 ENCOUNTER — Encounter: Admit: 2023-11-21 | Discharge: 2023-11-21 | Payer: MEDICARE

## 2023-11-21 DIAGNOSIS — C8178 Other classical Hodgkin lymphoma, lymph nodes of multiple sites: Secondary | ICD-10-CM

## 2023-11-21 NOTE — Progress Notes
 Name: Mary Terrell          MRN: [id number]      DOB: [birth year]      AGE: 68 y.o.   DATE OF SERVICE: 11/21/2023    Subjective:             Reason for Visit:  Cancer Follow up      [facility] Mary Terrell is a 68 y.o. female.      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  Presented December 2022 with close to a year of persistent dry cough and chest x-ray showed a mediastinal mass.  2.  CT scans of the chest abdomen pelvis revealed multifocal lymphadenopathy above and below the diaphragm along with splenic lesions and diffuse sclerosis of the L2 vertebral body suggestive of an infiltrative process.  3.  10/12/2021 excisional biopsy of right axillary node revealed classical Hodgkin lymphoma.  4.  11/03/2021 PET/CT revealed multifocal lymphadenopathy above and below the diaphragm consistent with stage III disease.  The L2 lesion was not noted hypermetabolic.  5.  BV + AVD initiated with CR (5 PS = 2) on interim PET/CT.  She did have multifocal pulmonary infiltrates with a negative bronchoscopy and was suspected to have drug-induced lung injury.  Brentuximab vedotin was held after 2 cycles.  She went on to complete 6 total cycles of therapy with a CR on end of treatment PET scan.    Interim History:  I dislocated my elbow shoveling snow.  Slipped on the ice and had to wear a brace for some time.  Had been doing very well - I felt great before that.  Stamina and strength are good.  Denies new or progressive adenopathy, fevers, drenching night sweats, or unintentional weight loss.  No interim fevers or infections.  No interim hospitalizations or transfusions.    I have reviewed and updated the past medical, social and family histories in the history section and they are up to date as of this visit.    I have extensively reviewed the laboratory, pathology and radiology, both internal and external, and the key findings are summarized above.         Review of Systems   All other systems reviewed and are negative.        Objective:          losartan-hydroCHLOROthiazide (HYZAAR) 100-12.5 mg tablet Take one tablet by mouth every morning.       Vitals:    11/21/23 1105 11/21/23 1107   BP: (!) 163/113 (!) 155/109   BP Source: Arm, Left Upper Arm, Left Upper   Pulse: 87    Temp: 36.9 ?C (98.5 ?F)    Resp: 16    SpO2: 99%    O2 Device: None (Room air)    TempSrc: Oral    PainSc: Zero    Weight: 81.6 kg (180 lb)      Body mass index is 29.05 kg/m?Marland Kitchen     Pain Score: Zero       Fatigue Scale: 0-None    Pain Addressed:  N/A  Patient Evaluated for a Clinical Trial: Patient not eligible for a treatment trial (including not needing treatment, needs palliative care, in remission).     [place] Cooperative Oncology Group performance status is 0, Fully active, able to carry on all pre-disease performance without restriction.       Physical Exam  Vitals and nursing note reviewed.   Constitutional:       General: She is not in acute distress.     Appearance: She is well-developed.   HENT:      Head: Normocephalic.   Eyes:      General: No scleral icterus.     Conjunctiva/sclera: Conjunctivae normal.   Cardiovascular:      Rate and Rhythm: Normal rate and regular rhythm.   Pulmonary:      Effort: Pulmonary effort is normal.      Breath sounds: Normal breath sounds.   Abdominal:      Palpations: Abdomen is soft.   Musculoskeletal:      Cervical back: Neck supple.   Lymphadenopathy:      Comments:   Cervical and supraclavicular nodes resolved   Skin:     Findings: No rash.   Neurological:      Mental Status: She is alert.               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  She has no signs or symptoms today that are worrisome for relapsed or recurrent disease.  Per the [place] data, patients like her who are out 2 years from chemoimmunotherapy have a life expectancy that goes back to that of the general age-matched population.  This is excellent news.  Unfortunately she did have a fall recently slipping on the ice.  This resulted in her elbow being dislocated and some decrement in her activities.  However, she does continue to be significantly better and is recovering nicely.  I did offer her physical therapy with oncology rehab services and she has elected not to pursue this.  Continue active surveillance with symptom directed imaging.  I do recommend an RSV vaccine, COVID booster, and that she initiate Shingrix vaccinations and high-dose pneumococcal vaccinations.  She will pursue these locally.  RTC with APP in 6 months, with me in 1 year, or sooner should new or concerning symptoms develop.    I have discussed the diagnosis and treatment plan with the patient and she expresses understanding and wishes to proceed.

## 2024-05-14 ENCOUNTER — Encounter: Admit: 2024-05-14 | Discharge: 2024-05-14 | Payer: MEDICARE

## 2024-05-19 NOTE — Progress Notes
 Name: Mary Terrell          MRN: [id number]      DOB: July 14, 1956      AGE: 68 y.o.   DATE OF SERVICE: 05/21/2024    Subjective:             Reason for Visit:  Cancer Follow up      [facility] Handler is a 68 y.o. female.      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 female patient of Dr.Hoffmann's with cHL seen today for evaluation and labs.  She completed 6 cycles of BV+AVD with CR according to her end of treatment PET scan.      Brentuximab was removed from regimen due to pulmonary findings during treatment.  Her last treatment with brentuximab was 12/23/2021 (cycle 2-day 15).     HPI  1.  Presented December 2022 with close to a year of persistent dry cough and chest x-ray showed a mediastinal mass.  2.  CT scans of the chest abdomen pelvis revealed multifocal lymphadenopathy above and below the diaphragm along with splenic lesions and diffuse sclerosis of the L2 vertebral body suggestive of an infiltrative process.  3.  10/12/2021 excisional biopsy of right axillary node revealed classical Hodgkin lymphoma.  4. BV+AVD started 11/11/2021  5.  Brentuximab removed from treatment after 12/23/2021 dose.  6.  BV + AVD initiated with CR (5 PS = 2) on interim PET/CT.  She did have multifocal pulmonary infiltrates with a negative bronchoscopy and was suspected to have drug-induced lung injury.  Brentuximab vedotin  was held after 2 cycles.  She went on to complete 6 total cycles of therapy with a CR on end of treatment PET scan.      Subjective  Doing well  Exercising daily  Working without trouble  Has a new grandbaby girl  Neuropathy-feet, hands, feels stronger, not painful and does not interfere with function  No fevers or sickness  Mood is good  Eating without trouble  Up-to-date with PCP  No swelling or skin changes  No chest pain or breathing changes  Interested in finding a new PCP in town    She is with husband.      Review of Systems      Objective:          losartan-hydroCHLOROthiazide (HYZAAR) 100-12.5 mg tablet Take one tablet by mouth every morning.     Vitals:    05/21/24 1318 05/21/24 1319   BP Source:  Arm, Left Upper   Pulse:  72   Temp:  36.8 ?C (98.2 ?F)   Resp:  16   SpO2:  98%   O2 Device: None (Room air)    TempSrc:  Oral   PainSc:  Zero   Weight:  82.9 kg (182 lb 12.8 oz)   Height:  166.4 cm (5' 5.51)       Body mass index is 29.95 kg/m?SABRA     Pain Score: Zero     Pain Addressed:  Prescription provided for pain management and Current regimen working to control pain.  [place] Cooperative Oncology Group performance status is 1, Restricted in physically strenuous activity but ambulatory  and able to carry out work of a light or sedentary nature, e.g., light house work, office work.     Physical Exam  Vitals reviewed.   Constitutional:       Appearance: She is well-developed.   HENT:      Head: Normocephalic.   Cardiovascular:      Rate and Rhythm: Normal rate and regular rhythm.   Pulmonary:      Effort: Pulmonary effort is normal.      Breath sounds: Normal breath sounds.   Abdominal:      General: There is no distension.      Palpations: Abdomen is soft. There is no mass.      Tenderness: There is no abdominal tenderness. There is no guarding.   Musculoskeletal:         General: Normal range of motion.      Cervical back: Normal range of motion.   Lymphadenopathy:      Cervical: No cervical adenopathy.      Upper Body:      Right upper body: No supraclavicular or axillary adenopathy.      Left upper body: No supraclavicular or axillary adenopathy.   Skin:     General: Skin is warm and dry.   Neurological:      Mental Status: She is alert and oriented to person, place, and time.            CBC w/Diff    Lab Results Component Value Date/Time    WBC 7.90 05/21/2024 12:45 PM    RBC 4.57 05/21/2024 12:45 PM    HGB 14.7 05/21/2024 12:45 PM    HCT 43.9 05/21/2024 12:45 PM    MCV 96.0 05/21/2024 12:45 PM    MCH 32.1 05/21/2024 12:45 PM    MCHC 33.4 05/21/2024 12:45 PM    RDW 13.1 05/21/2024 12:45 PM    PLTCT 224 05/21/2024 12:45 PM    MPV 7.8 05/21/2024 12:45 PM    Lab Results   Component Value Date/Time    NEUT 64.1 05/21/2024 12:45 PM    ANC 5.10 05/21/2024 12:45 PM    LYMA 26.3 05/21/2024 12:45 PM    ALC 2.10 05/21/2024 12:45 PM    MONA 5.9 05/21/2024 12:45 PM    AMC 0.50 05/21/2024 12:45 PM    EOSA 3.1 05/21/2024 12:45 PM    AEC 0.20 05/21/2024 12:45 PM    BASA 0.6 05/21/2024 12:45 PM    ABC 0.00 05/21/2024 12:45 PM             Assessment and Plan:       Advanced stage classical Hodgkin lymphoma, stage III.  She initiated brentuximab with AVD on 11/11/21.  She completed 6 cycles of treatment.  Due to pulmonary findings brentuximab was removed from her treatment (last given 12/23/21-cycle 2-day 15).  According to her end of treatment scan she obtained a complete remission.  She is doing very well and has no signs or symptoms of disease. Only residual side effect is very mild neuropathy. She will follow-up with Dr. Rosan as scheduled.   Peripheral neuropathy, grade 1.  It does not interfere with function and is not painful.  She is getting stronger and feels that this is helped.  Referral for PCP made today.  We discussed age-appropriate vaccines.  She is due for mammogram and colonoscopy.  She is active and I encouraged her to continue daily exercise.  Hypertension.  Her primary is managing.  She checks her blood pressure  at home.  She has whitecoat syndrome.  It was normal this morning.

## 2024-05-21 ENCOUNTER — Encounter: Admit: 2024-05-21 | Discharge: 2024-05-21 | Payer: MEDICARE

## 2024-05-21 DIAGNOSIS — R29898 Other symptoms and signs involving the musculoskeletal system: Secondary | ICD-10-CM

## 2024-05-21 DIAGNOSIS — G620 Drug-induced polyneuropathy: Secondary | ICD-10-CM

## 2024-05-21 DIAGNOSIS — C8178 Other classical Hodgkin lymphoma, lymph nodes of multiple sites: Principal | ICD-10-CM

## 2024-06-18 ENCOUNTER — Encounter: Admit: 2024-06-18 | Discharge: 2024-06-18 | Payer: MEDICARE

## 2024-10-03 ENCOUNTER — Encounter: Admit: 2024-10-03 | Discharge: 2024-10-03 | Payer: MEDICARE
